# Patient Record
Sex: Male | Born: 1940 | Race: White | Hispanic: No | Marital: Married | Smoking: Former smoker
Health system: Southern US, Community
[De-identification: ages and names within clinical notes are randomized; demographics above are authoritative.]

## PROBLEM LIST (undated history)

## (undated) DIAGNOSIS — K469 Unspecified abdominal hernia without obstruction or gangrene: Secondary | ICD-10-CM

## (undated) DIAGNOSIS — T7840XA Allergy, unspecified, initial encounter: Secondary | ICD-10-CM

## (undated) DIAGNOSIS — G709 Myoneural disorder, unspecified: Secondary | ICD-10-CM

## (undated) DIAGNOSIS — F329 Major depressive disorder, single episode, unspecified: Secondary | ICD-10-CM

## (undated) DIAGNOSIS — J439 Emphysema, unspecified: Secondary | ICD-10-CM

## (undated) DIAGNOSIS — I82409 Acute embolism and thrombosis of unspecified deep veins of unspecified lower extremity: Secondary | ICD-10-CM

## (undated) DIAGNOSIS — F32A Depression, unspecified: Secondary | ICD-10-CM

## (undated) HISTORY — PX: EYE SURGERY: SHX253

## (undated) HISTORY — DX: Depression, unspecified: F32.A

## (undated) HISTORY — DX: Emphysema, unspecified: J43.9

## (undated) HISTORY — PX: HERNIA REPAIR: SHX51

## (undated) HISTORY — DX: Unspecified abdominal hernia without obstruction or gangrene: K46.9

## (undated) HISTORY — PX: OTHER SURGICAL HISTORY: SHX169

## (undated) HISTORY — DX: Acute embolism and thrombosis of unspecified deep veins of unspecified lower extremity: I82.409

## (undated) HISTORY — DX: Myoneural disorder, unspecified: G70.9

## (undated) HISTORY — DX: Allergy, unspecified, initial encounter: T78.40XA

## (undated) HISTORY — DX: Major depressive disorder, single episode, unspecified: F32.9

---

## 1990-06-05 HISTORY — PX: HERNIA REPAIR: SHX51

## 1991-06-06 DIAGNOSIS — I82409 Acute embolism and thrombosis of unspecified deep veins of unspecified lower extremity: Secondary | ICD-10-CM

## 1991-06-06 HISTORY — DX: Acute embolism and thrombosis of unspecified deep veins of unspecified lower extremity: I82.409

## 1994-06-05 HISTORY — PX: HERNIA REPAIR: SHX51

## 2000-11-06 ENCOUNTER — Encounter: Payer: Self-pay | Admitting: Surgery

## 2000-11-06 ENCOUNTER — Encounter: Admission: RE | Admit: 2000-11-06 | Discharge: 2000-11-06 | Payer: Self-pay | Admitting: Surgery

## 2000-11-08 ENCOUNTER — Ambulatory Visit (HOSPITAL_BASED_OUTPATIENT_CLINIC_OR_DEPARTMENT_OTHER): Admission: RE | Admit: 2000-11-08 | Discharge: 2000-11-08 | Payer: Self-pay | Admitting: Surgery

## 2000-11-08 ENCOUNTER — Encounter (INDEPENDENT_AMBULATORY_CARE_PROVIDER_SITE_OTHER): Payer: Self-pay | Admitting: *Deleted

## 2002-07-24 ENCOUNTER — Encounter: Payer: Self-pay | Admitting: Internal Medicine

## 2002-07-24 ENCOUNTER — Encounter: Admission: RE | Admit: 2002-07-24 | Discharge: 2002-07-24 | Payer: Self-pay | Admitting: Internal Medicine

## 2002-11-18 ENCOUNTER — Encounter: Payer: Self-pay | Admitting: Internal Medicine

## 2002-11-18 ENCOUNTER — Encounter: Admission: RE | Admit: 2002-11-18 | Discharge: 2002-11-18 | Payer: Self-pay | Admitting: Internal Medicine

## 2004-06-08 ENCOUNTER — Emergency Department (HOSPITAL_COMMUNITY): Admission: EM | Admit: 2004-06-08 | Discharge: 2004-06-08 | Payer: Self-pay | Admitting: Emergency Medicine

## 2004-06-09 ENCOUNTER — Inpatient Hospital Stay (HOSPITAL_COMMUNITY): Admission: AD | Admit: 2004-06-09 | Discharge: 2004-06-16 | Payer: Self-pay | Admitting: Psychiatry

## 2004-06-09 ENCOUNTER — Ambulatory Visit: Payer: Self-pay | Admitting: Psychiatry

## 2004-09-27 ENCOUNTER — Encounter: Admission: RE | Admit: 2004-09-27 | Discharge: 2004-09-27 | Payer: Self-pay | Admitting: Family Medicine

## 2004-11-03 ENCOUNTER — Encounter: Admission: RE | Admit: 2004-11-03 | Discharge: 2004-11-03 | Payer: Self-pay | Admitting: Internal Medicine

## 2004-11-08 ENCOUNTER — Encounter: Admission: RE | Admit: 2004-11-08 | Discharge: 2004-11-08 | Payer: Self-pay | Admitting: Internal Medicine

## 2004-11-21 ENCOUNTER — Ambulatory Visit: Payer: Self-pay | Admitting: Internal Medicine

## 2004-11-25 ENCOUNTER — Ambulatory Visit: Payer: Self-pay | Admitting: Internal Medicine

## 2004-11-29 ENCOUNTER — Ambulatory Visit: Payer: Self-pay | Admitting: Internal Medicine

## 2005-05-22 ENCOUNTER — Ambulatory Visit: Payer: Self-pay | Admitting: Internal Medicine

## 2005-05-30 ENCOUNTER — Ambulatory Visit: Payer: Self-pay | Admitting: Internal Medicine

## 2005-06-05 HISTORY — PX: HERNIA REPAIR: SHX51

## 2005-09-26 ENCOUNTER — Ambulatory Visit: Payer: Self-pay | Admitting: Internal Medicine

## 2006-01-24 ENCOUNTER — Emergency Department (HOSPITAL_COMMUNITY): Admission: EM | Admit: 2006-01-24 | Discharge: 2006-01-24 | Payer: Self-pay | Admitting: Emergency Medicine

## 2006-01-28 ENCOUNTER — Emergency Department (HOSPITAL_COMMUNITY): Admission: EM | Admit: 2006-01-28 | Discharge: 2006-01-28 | Payer: Self-pay | Admitting: Emergency Medicine

## 2006-01-31 ENCOUNTER — Encounter (HOSPITAL_COMMUNITY): Admission: RE | Admit: 2006-01-31 | Discharge: 2006-05-01 | Payer: Self-pay | Admitting: Emergency Medicine

## 2006-02-21 ENCOUNTER — Inpatient Hospital Stay (HOSPITAL_COMMUNITY): Admission: RE | Admit: 2006-02-21 | Discharge: 2006-02-22 | Payer: Self-pay | Admitting: Surgery

## 2007-01-23 ENCOUNTER — Ambulatory Visit: Payer: Self-pay | Admitting: Internal Medicine

## 2007-08-03 ENCOUNTER — Emergency Department (HOSPITAL_COMMUNITY): Admission: EM | Admit: 2007-08-03 | Discharge: 2007-08-04 | Payer: Self-pay | Admitting: Emergency Medicine

## 2010-06-05 HISTORY — PX: HERNIA REPAIR: SHX51

## 2010-08-17 ENCOUNTER — Emergency Department (HOSPITAL_COMMUNITY): Payer: Medicare Other

## 2010-08-17 ENCOUNTER — Emergency Department (HOSPITAL_COMMUNITY)
Admission: EM | Admit: 2010-08-17 | Discharge: 2010-08-17 | Disposition: A | Discharge status: Home / Self Care | Payer: Medicare Other | Attending: Emergency Medicine | Admitting: Emergency Medicine

## 2010-08-17 DIAGNOSIS — F319 Bipolar disorder, unspecified: Secondary | ICD-10-CM | POA: Insufficient documentation

## 2010-08-17 DIAGNOSIS — R110 Nausea: Secondary | ICD-10-CM | POA: Insufficient documentation

## 2010-08-17 DIAGNOSIS — R109 Unspecified abdominal pain: Secondary | ICD-10-CM | POA: Insufficient documentation

## 2010-08-17 DIAGNOSIS — K458 Other specified abdominal hernia without obstruction or gangrene: Secondary | ICD-10-CM | POA: Insufficient documentation

## 2010-08-17 LAB — DIFFERENTIAL
Eosinophils Relative: 0 % (ref 0–5)
Lymphocytes Relative: 7 % — ABNORMAL LOW (ref 12–46)
Lymphs Abs: 0.9 10*3/uL (ref 0.7–4.0)
Monocytes Absolute: 0.8 10*3/uL (ref 0.1–1.0)
Monocytes Relative: 6 % (ref 3–12)
Neutro Abs: 11.4 10*3/uL — ABNORMAL HIGH (ref 1.7–7.7)

## 2010-08-17 LAB — COMPREHENSIVE METABOLIC PANEL
Albumin: 3.6 g/dL (ref 3.5–5.2)
Alkaline Phosphatase: 83 U/L (ref 39–117)
BUN: 11 mg/dL (ref 6–23)
Calcium: 9.1 mg/dL (ref 8.4–10.5)
Creatinine, Ser: 0.92 mg/dL (ref 0.4–1.5)
Glucose, Bld: 125 mg/dL — ABNORMAL HIGH (ref 70–99)
Potassium: 4.4 mEq/L (ref 3.5–5.1)
Total Protein: 7.1 g/dL (ref 6.0–8.3)

## 2010-08-17 LAB — VALPROIC ACID LEVEL: Valproic Acid Lvl: 30.2 ug/mL — ABNORMAL LOW (ref 50.0–100.0)

## 2010-08-17 LAB — CBC
HCT: 41.8 % (ref 39.0–52.0)
Hemoglobin: 14 g/dL (ref 13.0–17.0)
MCH: 31.2 pg (ref 26.0–34.0)
MCHC: 33.5 g/dL (ref 30.0–36.0)
MCV: 93.1 fL (ref 78.0–100.0)
RDW: 13.2 % (ref 11.5–15.5)

## 2010-08-17 LAB — LIPASE, BLOOD: Lipase: 31 U/L (ref 11–59)

## 2010-09-07 ENCOUNTER — Other Ambulatory Visit: Payer: Self-pay | Admitting: Surgery

## 2010-09-07 ENCOUNTER — Ambulatory Visit
Admission: RE | Admit: 2010-09-07 | Discharge: 2010-09-07 | Disposition: A | Discharge status: Home / Self Care | Payer: Medicare Other | Source: Ambulatory Visit | Attending: Surgery | Admitting: Surgery

## 2010-09-27 ENCOUNTER — Ambulatory Visit: Payer: Medicare Other | Admitting: Internal Medicine

## 2010-10-13 ENCOUNTER — Encounter: Payer: Self-pay | Admitting: Internal Medicine

## 2010-10-13 ENCOUNTER — Ambulatory Visit (INDEPENDENT_AMBULATORY_CARE_PROVIDER_SITE_OTHER): Payer: Medicare Other | Admitting: Internal Medicine

## 2010-10-13 ENCOUNTER — Ambulatory Visit (INDEPENDENT_AMBULATORY_CARE_PROVIDER_SITE_OTHER)
Admission: RE | Admit: 2010-10-13 | Discharge: 2010-10-13 | Disposition: A | Discharge status: Home / Self Care | Payer: Medicare Other | Source: Ambulatory Visit | Attending: Internal Medicine | Admitting: Internal Medicine

## 2010-10-13 ENCOUNTER — Other Ambulatory Visit (INDEPENDENT_AMBULATORY_CARE_PROVIDER_SITE_OTHER): Payer: Medicare Other

## 2010-10-13 VITALS — BP 110/62 | HR 60 | Temp 98.3°F | Resp 16 | Wt 200.0 lb

## 2010-10-13 DIAGNOSIS — N400 Enlarged prostate without lower urinary tract symptoms: Secondary | ICD-10-CM

## 2010-10-13 DIAGNOSIS — Z79899 Other long term (current) drug therapy: Secondary | ICD-10-CM

## 2010-10-13 DIAGNOSIS — F319 Bipolar disorder, unspecified: Secondary | ICD-10-CM

## 2010-10-13 DIAGNOSIS — R05 Cough: Secondary | ICD-10-CM

## 2010-10-13 DIAGNOSIS — Z0000 Encounter for general adult medical examination without abnormal findings: Secondary | ICD-10-CM

## 2010-10-13 DIAGNOSIS — Z125 Encounter for screening for malignant neoplasm of prostate: Secondary | ICD-10-CM

## 2010-10-13 LAB — COMPREHENSIVE METABOLIC PANEL
BUN: 10 mg/dL (ref 6–23)
CO2: 31 mEq/L (ref 19–32)
Calcium: 9.2 mg/dL (ref 8.4–10.5)
Chloride: 104 mEq/L (ref 96–112)
Creatinine, Ser: 0.9 mg/dL (ref 0.4–1.5)
GFR: 83.97 mL/min (ref >60.00)

## 2010-10-13 LAB — CBC WITH DIFFERENTIAL/PLATELET
Basophils Absolute: 0 10*3/uL (ref 0.0–0.1)
Basophils Relative: 0.4 % (ref 0.0–3.0)
Eosinophils Absolute: 0.3 10*3/uL (ref 0.0–0.7)
Lymphocytes Relative: 33.6 % (ref 12.0–46.0)
MCHC: 34.3 g/dL (ref 30.0–36.0)
Monocytes Relative: 11.5 % (ref 3.0–12.0)
Neutrophils Relative %: 49.7 % (ref 43.0–77.0)
RBC: 4.24 Mil/uL (ref 4.22–5.81)
RDW: 13.2 % (ref 11.5–14.6)

## 2010-10-13 LAB — URINALYSIS, ROUTINE W REFLEX MICROSCOPIC
Leukocytes, UA: NEGATIVE
Nitrite: NEGATIVE
Specific Gravity, Urine: 1.02 (ref 1.000–1.030)
Urobilinogen, UA: 0.2 (ref 0.0–1.0)

## 2010-10-13 LAB — LIPID PANEL
HDL: 43.3 mg/dL (ref >39.00)
Triglycerides: 120 mg/dL (ref 0.0–149.0)
VLDL: 24 mg/dL (ref 0.0–40.0)

## 2010-10-13 MED ORDER — PNEUMOCOCCAL VAC POLYVALENT 25 MCG/0.5ML IJ INJ: 0.5000 mL | INJECTION | Freq: Once | INTRAMUSCULAR | Status: DC

## 2010-10-13 NOTE — Assessment & Plan Note (Signed)
Will check a psa and ua

## 2010-10-13 NOTE — Progress Notes (Signed)
Subjective:    Patient ID: Christian Hurley, male    DOB: 1940/10/18, 70 y.o.   MRN: [id number]  HPI New to me he comes in for a complete physical and reports a chronic smoker's cough, he saw Dr. 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 His cough has not worsened. He sees Matthew Saras for bipolar disease and needs labs done.   Review of Systems  Constitutional: Negative for fever, chills, diaphoresis, activity change, appetite change, fatigue and unexpected weight change.  HENT: Negative for sore throat, facial swelling, trouble swallowing, neck pain, neck stiffness and voice change.   Respiratory: Negative for apnea, cough, choking, chest tightness, shortness of breath, wheezing and stridor.   Cardiovascular: Negative for chest pain, palpitations and leg swelling.  Gastrointestinal: Negative for nausea, vomiting, abdominal pain, diarrhea, constipation, blood in stool, abdominal distention, anal bleeding and rectal pain.  Genitourinary: Negative for dysuria, urgency, frequency, hematuria, flank pain, decreased urine volume, discharge, penile swelling, scrotal swelling, enuresis, difficulty urinating, genital sores, penile pain and testicular pain.  Musculoskeletal: Negative for myalgias, back pain, joint swelling, arthralgias and gait problem.  Skin: Negative for color change, pallor and rash.  Neurological: Negative for dizziness, tremors, seizures, syncope, facial asymmetry, speech difficulty, weakness, light-headedness, numbness and headaches.  Hematological: Negative for adenopathy. Does not bruise/bleed easily.  Psychiatric/Behavioral: Negative for suicidal ideas, hallucinations, behavioral problems, confusion, sleep disturbance, self-injury, dysphoric mood, decreased concentration and agitation. The patient is not nervous/anxious and is not hyperactive.        Objective:   Physical Exam  [vitalsreviewed. Constitutional: He is oriented to person, place,  and time. He appears well-developed and well-nourished. No distress.  HENT:  Head: Normocephalic and atraumatic.  Right Ear: External ear normal.  Left Ear: External ear normal.  Nose: Nose normal.  Mouth/Throat: Oropharynx is clear and moist. No oropharyngeal exudate.  Eyes: Conjunctivae and EOM are normal. Pupils are equal, round, and reactive to light. Right eye exhibits no discharge. Left eye exhibits no discharge. No scleral icterus.  Neck: Normal range of motion. Neck supple. No JVD present. No tracheal deviation present. No thyromegaly present.  Cardiovascular: Normal rate, regular rhythm, normal heart sounds and intact distal pulses.  Exam reveals no gallop and no friction rub.   No murmur heard. Pulmonary/Chest: Effort normal and breath sounds normal. No stridor. No respiratory distress. He has no wheezes. He has no rales. He exhibits no tenderness.  Abdominal: Soft. Normal aorta and bowel sounds are normal. He exhibits no shifting dullness, no distension, no pulsatile liver, no fluid wave, no abdominal bruit, no ascites, no pulsatile midline mass and no mass. There is no hepatosplenomegaly, splenomegaly or hepatomegaly. There is no tenderness. There is no rigidity, no rebound, no guarding, no CVA tenderness, no tenderness at McBurney's point and negative Murphy's sign. A hernia is present. Hernia confirmed positive in the ventral area. Hernia confirmed negative in the right inguinal area and confirmed negative in the left inguinal area.    Genitourinary: Rectum normal, prostate normal, testes normal and penis normal. Rectal exam shows no external hemorrhoid, no internal hemorrhoid, no fissure, no mass, no tenderness and anal tone normal. Guaiac negative stool. Prostate is not enlarged and not tender. Right testis shows no mass, no swelling and no tenderness. Left testis shows no mass, no swelling and no tenderness. Circumcised. No penile erythema or penile tenderness. No discharge found.    Musculoskeletal: Normal range of motion. He exhibits no edema  and no tenderness.  Lymphadenopathy:    He has no cervical adenopathy.       Right: No inguinal adenopathy present.       Left: No inguinal adenopathy present.  Neurological: He is alert and oriented to person, place, and time. He has normal reflexes. He displays normal reflexes. No cranial nerve deficit. He exhibits normal muscle tone. Coordination normal.  Skin: Skin is warm and dry. No rash noted. He is not diaphoretic. No erythema. No pallor.  Psychiatric: He has a normal mood and affect. His behavior is normal. Judgment normal.          Assessment & Plan:

## 2010-10-13 NOTE — Assessment & Plan Note (Signed)
The patient is here for annual Medicare wellness examination and management of other chronic and acute problems.   The risk factors are reflected in the social history.  The roster of all physicians providing medical care to patient - is listed in the Snapshot section of the chart.  Activities of daily living:  The patient is 100% inedpendent in all ADLs: dressing, toileting, feeding as well as independent mobility  Home safety : The patient has smoke detectors in the home. They wear seatbelts.No firearms at home ( firearms are present in the home, kept in a safe fashion). There is no violence in the home.   There is no risks for hepatitis, STDs or HIV. There is no   history of blood transfusion. They have no travel history to infectious disease endemic areas of the world.  The patient has (has not) seen their dentist in the last six month. They have (not) seen their eye doctor in the last year. They deny (admit to) any hearing difficulty and have not had audiologic testing in the last year.  They do not  have excessive sun exposure. Discussed the need for sun protection: hats, long sleeves and use of sunscreen if there is significant sun exposure.   Diet: the importance of a healthy diet is discussed. They do have a healthy (unhealthy-high fat/fast food) diet.   Depression screen: there are no signs or vegative symptoms of depression- irritability, change in appetite, anhedonia, sadness/tearfullness.  Cognitive assessment: the patient manages all their financial and personal affairs and is actively engaged. They could relate day,date,year and events; recalled 3/3 objects at 3 minutes; performed clock-face test normally.  The following portions of the patient's history were reviewed and updated as appropriate: allergies, current medications, past family history, past medical history,  past surgical history, past social history  and problem list.  Vision, hearing, body mass index were assessed  and reviewed.   During the course of the visit the patient was educated and counseled about appropriate screening and preventive services including : fall prevention , diabetes screening, nutrition counseling, colorectal cancer screening, and recommended immunizations.  

## 2010-10-13 NOTE — Assessment & Plan Note (Signed)
I will check a cxr to see if cancer has developed

## 2010-10-13 NOTE — Assessment & Plan Note (Signed)
PSA ordered

## 2010-10-13 NOTE — Assessment & Plan Note (Signed)
Labs ordered and I will fax them to [facility]

## 2010-10-13 NOTE — Patient Instructions (Signed)
Health Maintenance in Males MAINTAIN REGULAR HEALTH EXAMS  Maintain a healthy diet and normal weight. Increased weight leads to problems with blood pressure and diabetes. Decrease fat in the diet and increase exercise. Obtain a proper diet from your caregiver if necessary.   High blood pressure causes heart and blood vessel problems. Check blood pressures regularly and keep your blood pressure at normal limits. Aerobic exercise helps this. Persistent elevations of blood pressure should be treated with medications if weight loss and exercise are ineffective.   Avoid smoking, drinking in excess (more than 2 drinks per day), or use of street drugs. Do not share needles with anyone. Ask for help if you need assistance or instructions on stopping the use of alcohol, cigarettes, or drugs.   Maintain normal blood lipids and cholesterol. Your caregiver can give you information to lower your risk of heart disease or stroke.   Ask your caregiver if you are in need of early heart disease screening because of a strong family history of heart disease or signs of elevated testosterone (male sex hormone) levels. These can predispose you to early heart disease.   Practice safe sex. Practicing safe sex decreases your risk for a sexually transmitted infection (STI). Some of the STIs are gonorrhea, chlamydia, syphilis, trichimonas, herpes, human papillomavirus (HPV), and human immunodeficiency virus (HIV). Herpes, HIV, and HPV are viral illnesses that have no cure. These can result in disability, cancer, and death.   It is not safe for someone who has AIDS or is HIV positive to have unprotected sex with a partner who is HIV positive. The reason for this is the fact that there are many different strains of HIV. If you have a strain that is readily treated with medications and then suddenly introduce a strain from a partner that has no further treatment options, you may suddenly have a strain of HIV that is untreatable.  Even if you are both positive for HIV, it is still necessary to practice safe sex.   Use sunscreen with a SPF of 15 or greater. Being outside in the sun when your shadow caused by the sun is shorter than you are, means you are being exposed to sun at greater intensity. Lighter skinned people are at a greater risk of skin cancer.   Keep carbon monoxide and smoke detectors in your home and functioning at all times. Change the batteries every 6 months.   Do monthly examinations of your testicles. The best time to do this is after a hot shower or bath when the tissues are loose. Notify your caregivers of any lumps, tenderness, or changes in size or shape.   Notify your caregiver of new moles or changes in moles, especially if there is a change in shape or color. Also notify your caregiver if a mole is larger than the size of a pencil eraser.   Stay current with your tetanus shots and other required immunizations.  The Body Mass Index (BMI) is a way of measuring how much of your body is fat. Having a BMI above 27 increases the risk of heart disease, diabetes, hypertension, stroke, and other problems related to obesity. Document Released: 11/18/2007 Document Re-Released: 11/09/2009 [employer] Patient Information 2011 [employer]. 

## 2010-10-14 ENCOUNTER — Encounter: Payer: Self-pay | Admitting: Internal Medicine

## 2010-10-18 ENCOUNTER — Encounter (INDEPENDENT_AMBULATORY_CARE_PROVIDER_SITE_OTHER): Payer: Self-pay | Admitting: Surgery

## 2010-10-18 NOTE — Assessment & Plan Note (Signed)
[facility]                             PULMONARY OFFICE NOTE   Christian Hurley, Christian Hurley                      MRN:          [id number]  DATE:01/23/2007                            DOB:          August 05, 1940    PROBLEMS:  1. Asthma/chronic obstructive pulmonary disease.  2. Remote history of deep vein thrombosis/pulmonary embolism in 1993.  3. Abnormal chest CAT.  4. Bipolar disorder.   HISTORY:  Summer heat is bothering some, but he is outside very little.  He got through an abdominal hernia repair, leaving him with a long  midline incision, but apparently tolerated the anesthesia with no  problems.  Rare need, now, for albuterol.  He does notice some  exertional dyspnea; but I do not get the impression that it is changed.  We reviewed his spirometry done in June of 2006 when he had moderate  obstructive disease with significant response to bronchodilator, leaving  him only mildly obstructed at 72% after dilator.   MEDICATIONS:  1. Luvox 100 mg b.i.d.  2. Depakote 1000 mg.  3. Albuterol rescue inhaler used sometimes once a day.   ALLERGIES:  No medication allergy.   OBJECTIVE:  Active facial tics.  There are few faint crackles on chest exam.  Heart sounds are regular without murmur.  Midline abdominal scar.  No edema, no cyanosis.   IMPRESSION:  Asthma/chronic obstructive pulmonary disease currently  stable.   PLAN:  Schedule return in one year.  We refilled albuterol as a rescue  inhaler with discussion of change.  We will probably want to repeat the  pulmonary function tests when he returns.     Clinton D. Maple Hudson, MD, Tonny Bollman, FACP  Electronically Signed    CDY/MedQ  DD: 01/23/2007  DT: 01/24/2007  Job #: [id number]   cc:   Lucita Ferrara, MD

## 2010-10-21 NOTE — Discharge Summary (Signed)
NAMEEDRICK, WHITEHORN               ACCOUNT NO.:  [id number]   MEDICAL RECORD NO.:  [id number]          PATIENT TYPE:  INP   LOCATION:  5731                         FACILITY:  [facility]   PHYSICIAN:  Velora Heckler, MD      DATE OF BIRTH:  20-Aug-1940   DATE OF ADMISSION:  02/20/2006  DATE OF DISCHARGE:  02/22/2006                                 DISCHARGE SUMMARY   REASON FOR ADMISSION:  Ventral incisional hernia, recurrent, and  incarcerated umbilical hernia.   HISTORY OF PRESENT ILLNESS:  The patient is a 70 year old white male, well-  known to my surgical practice.  He had a ventral hernia repair in June 2002.  He has now had recurrence.  He also has an incarcerated umbilical hernia.  He comes to surgery for repair.   HOSPITAL COURSE:  The patient was taken to the operating room on February 20, 2006.  He underwent exploration of his abdominal wound, extraction of  the previous mesh, repair of ventral hernia with AlloDerm and Prolene mesh,  and repair of umbilical hernia with Prolene mesh.  Postoperatively, the  patient did well.  He resumed his diet.  He required local wound care and  teaching regarding drain care.  He is prepared for discharge home on the  second postoperative day.   DISCHARGE PLANNING:  The patient is discharged home on February 22, 2006 in  good condition, tolerating a regular diet, AND ambulating independently.  The patient will be seen back in my office at [facility] surgery in 5  days for wound check and drain removal.   DISCHARGE MEDICATIONS:  Include Vicodin as needed for pain.   FINAL DIAGNOSES:  1. Recurrent ventral incisional hernia.  2. Incarcerated umbilical hernia.   CONDITION ON DISCHARGE:  Improved.      Velora Heckler, MD  Electronically Signed     TMG/MEDQ  D:  02/22/2006  T:  02/24/2006  Job:  [id number]   cc:   Marcene Duos, M.D.

## 2010-10-21 NOTE — Discharge Summary (Signed)
Christian Hurley, PORZIO NO.:  [id number]   MEDICAL RECORD NO.:  [id number]          PATIENT TYPE:  IPS   LOCATION:  0504                          FACILITY:  [facility]   PHYSICIAN:  Geoffery Lyons, M.D.      DATE OF BIRTH:  Nov 02, 1940   DATE OF ADMISSION:  06/09/2004  DATE OF DISCHARGE:  06/16/2004                                 DISCHARGE SUMMARY   CHIEF COMPLAINT AND PRESENTING ILLNESS:  This was the first admission to  [facility]  for this 70 year old married white male,  involuntarily committed.  The patient had been hostile since Christmas day,  pushing his wife and threatening her.  He was considered a danger to himself  and others.  He has been cursing people in the cafeterias since he has been  hospitalized, has been agitated.   PAST PSYCHIATRIC HISTORY:  First time [employer], unclear psychiatric  history at the time of the initial evaluation.   ALCOHOL AND DRUG HISTORY:  Nonsmoker, no clear evidence of any alcohol or  substance use.   PAST MEDICAL HISTORY:  Noncontributory.   MEDICATIONS:  Luvox 100 twice a day, Valium 5 mg twice a day, Depakote ER  500 mg daily, Klonopin 0.5 at night.   PHYSICAL EXAMINATION:  Performed, failed to show any acute findings.   LABORATORY WORKUP:  CBC was within normal limits.  CMET:  Blood sugar 104.  Depakote level 39.  Blood screen positive for benzodiazepines.  CT scan was  negative.   MENTAL STATUS EXAM:  Reveals a sleepy, elderly male, difficult to provide  any information due to his sedation.  Speech was somewhat slurred at times  but he was trying to cooperate.  Mood was tired, affect was sedated.  Thought processes were not spontaneous, did not seem to be responding to  internal stimuli.  There was no evidence of delusions, no evidence of  hallucinations.  Cognition well preserved.   ADMISSION DIAGNOSES:   AXIS I:  Bipolar disorder.   AXIS II:  No diagnosis.   AXIS III:  No  diagnosis.   AXIS IV:  Moderate.   AXIS V:  Global assessment of function upon admission 25, highest global  assessment of function in past year 65.   COURSE IN HOSPITAL:  He was admitted and started on individual and group  psychotherapy.  He was maintained on Luvox 100 mg twice a day, Klonopin 0.5  at night, Valium 5 mg twice a day as needed, Depakote ER 500 mg every day.  Initially he was given Seroquel 100 every 6 hours as needed for agitation.  Seemed to be too strong so it was decreased to 50 every 6 hours as needed.  Klonopin was changed to 1 mg 4 times a day as needed.  Depakote was placed  at Depakote ER 500 in the morning and 500 at night.  He was also given  Seroquel 50 twice a day and 50 at night.  He was given Cipro.  Klonopin was  discontinued and his Valium was initially increased.  He apparently was  placed  on Cipro for a possible UTI.  Apparently he had bladder surgery.  He  was being treated with Cipro.  It was established that he had a history of  bipolar disorder.  He has been angry and irritable.  Initially loud, easily  redirectable.  He started tolerating medication well.  He did not remember  being argumentative with his wife.  Endorsed he was having a lot of  frustration and financial stressors.  He was concerned that his wife managed  his money and did not know where it went.  He was cooperative but somewhat  withdrawn.  There was some information that the sister called the unit  saying that the wife had gone to the magistrate to have him committed, the  sister claimed that the wife spent the patient's money.  He did endorse that  he might have been having mood swings, but he was willing to accept the  medication, endorsed he did not remember what happened.  Initially some  psychomotor retardation.  He settled down and he was more subdued.  Family  session with the wife, she was concerned about his walking, some sedation.  We adjusted the medication further.   He started saying that he was feeling  better.  He was going to group.  With adjusted medication he was able to  rest a night.  His mood improved, his affect was brighter, and on January 12  he was in full contact with reality.  There were no suicidal ideas, no  homicidal ideas, no hallucinations, no delusions.  He was wanting to go  home.  He was compliant with medications.  No evidence of aggressiveness.  He was willing to pursue outpatient treatment, so we went ahead and  discharged to outpatient follow-up.   DISCHARGE DIAGNOSES:   AXIS I:  Bipolar disorder.   AXIS II:  No diagnosis.   AXIS III:  No diagnosis.   AXIS IV:  Moderate.   AXIS V:  Global assessment of function upon discharge 50.   DISCHARGE MEDICATIONS:  1.  Luvox 100 twice a day.  2.  Depakote ER 500 in the morning, 500 at night.  3.  Cipro 500 twice a day for 3 weeks.  4.  Valium 5 mg 1/2 twice a day and at night.   DISPOSITION:  Follow up with Rowland Lathe and Dr. Archie Patten.      IL/MEDQ  D:  07/05/2004  T:  07/05/2004  Job:  [id number]

## 2010-10-21 NOTE — Op Note (Signed)
Christian Hurley, Christian Hurley               ACCOUNT NO.:  [id number]   MEDICAL RECORD NO.:  [id number]          PATIENT TYPE:  OIB   LOCATION:  5731                         FACILITY:  [facility]   PHYSICIAN:  Velora Heckler, MD      DATE OF BIRTH:  Nov 05, 1940   DATE OF PROCEDURE:  02/20/2006  DATE OF DISCHARGE:                                 OPERATIVE REPORT   PREOPERATIVE DIAGNOSES:  1. Recurrent ventral incisional hernia.  2. Incarcerated umbilical hernia.   POSTOPERATIVE DIAGNOSES:  1. Recurrent ventral incisional hernia.  2. Incarcerated umbilical hernia.   PROCEDURE:  1. Extraction mesh from epigastrium.  2. Repair recurrent ventral incisional hernia with AlloDerm and Prolene      mesh.  3. Repair umbilical hernia with Prolene mesh.   SURGEON:  Velora Heckler, M.D., FACS   ANESTHESIA:  General.   ESTIMATED BLOOD LOSS:  Minimal.   PREPARATION:  Betadine.   COMPLICATIONS:  None.   INDICATIONS:  The patient is a 70 year old white male well known to my  surgical practice.  He had had previous ventral hernia repair in June2002 at  [facility] day surgery center.  The patient had developed a significant  stasis.  However, the patient returned in July2007 with a frank recurrence  of this ventral incisional hernia as well as an incarcerated umbilical  hernia.  He now comes to surgery for concurrent repair.   BODY OF REPORT:  Procedure is done in OR #16 at [facility].  The patient was brought to the operating room, placed in supine  position on the operating room table.  Following administration of general  anesthesia, the patient is prepped and draped in the usual strict aseptic  fashion.  After ascertaining that an adequate level of anesthesia had been  obtained, midline abdominal incision was made from the xiphoid process to  the umbilicus with a #10 blade.  Dissection was carried through subcutaneous  tissues.  In the upper abdomen there is incarcerated omentum.   Dissection  was carried down to the fascial plane which was then developed  circumferentially using skin rakes and the electrocautery.  At the umbilicus  there is incarcerated omentum.  This was dissected out the subcutaneous  tissues down to the fascia.  It is transected at the level of the fascia  with electrocautery and discarded.  Remaining adipose tissue was reduced  back within the peritoneal cavity.  Fascial defect measures 1 cm.  It is  closed with interrupted #1 Novofil sutures.   In the epigastrium there is a previous sheet of what appears to be Prolene  mesh.  The recurrence has occurred around the inferior edge of the mesh and  over the superior edge of the mesh at the level of the xiphoid process.  The  mesh was dissected out and is dissected off of the anterior abdominal wall  and discarded.  Adherent to the undersurface of the mesh was omentum which  was dissected off the mesh with electrocautery and good hemostasis obtained  with electrocautery.  No bowel involvement is identified.  The fascial  defect measures approximately 5 x 8 cm in size.  Due to its location  adjacent to the costal margin and the xiphoid process, reapproximation of  the fascia was not possible.  Therefore the fascia is replaced with a 4 x 7  cm sheet of AlloDerm.  The AlloDerm is opened and brought on the field.  It  was trimmed to the appropriate dimensions.  It is secured to the edges of  the fascial defect circumferentially with interrupted #1 Novofil sutures.  AlloDerm covers the defect nicely with good tension across the graft.  Next  the entire anterior abdominal wall from above the xiphoid process to below  the umbilicus was reinforced with a sheet of polypropylene mesh.  Mesh was  cut to the appropriate dimensions.  It is secured circumferentially with  interrupted #1 Novofil sutures to the anterior fascia of the abdominal wall.  There is good approximation of the mesh to the wall throughout.   Two 30-  Jamaica Blake drains were brought in inferiorly and placed over the mesh on  both the left and right sides.  They are secured to the skin with 3-0 nylon  sutures.  Subcutaneous tissues were closed with running 2-0 Vicryl sutures.  Wound is irrigated.  Skin was closed with stainless steel staples.  Drains  were placed to bulb suction.  Sterile dressings were applied.  Abdominal  binder was applied.  The patient is awakened from anesthesia and brought to  the recovery room in stable condition.  The patient tolerated the procedure  well.      Velora Heckler, MD  Electronically Signed     TMG/MEDQ  D:  02/20/2006  T:  02/21/2006  Job:  [id number]   cc:   Marcene Duos, M.D.

## 2010-10-21 NOTE — Op Note (Signed)
[place]. [facility]  Patient:    Christian Hurley, Christian Hurley                      MRN: [id number] Proc. Date: 11/08/00 Adm. Date:  [id number] Attending:  Bonnetta Barry CC:         Julieanne Manson, M.D.   Operative Report  PREOPERATIVE DIAGNOSIS: 1. Ventral hernia. 2. Sebaceous cyst on back.  POSTOPERATIVE DIAGNOSIS: 1. Ventral hernia. 2. Sebaceous cyst on back.  OPERATION PERFORMED: 1. Repair of ventral hernia with Prolene mesh. 2. Excise sebaceous cyst from back (2 x 3 x 4 cm).  SURGEON:  Velora Heckler, M.D.  ANESTHESIA:  General.  ESTIMATED BLOOD LOSS:  Minimal.  PREPARATION:  Betadine.  COMPLICATIONS:  None.  INDICATIONS FOR PROCEDURE:  The patient is a 70 year old white male who presents with ventral hernia in the epigastrium which has become increasingly larger in size and causing mild symptoms, mainly discomfort.  The patient also has a large sebaceous cyst on the back which was causing him discomfort.  He now comes to surgery for ventral hernia and excision of sebaceous cyst.  DESCRIPTION OF PROCEDURE:  The procedure was done in OR #7 at the [facility].  The patient was brought to the operating room and placed in supine position on the operating room table.  Following administration of general anesthesia, the patient was prepped and draped in the usual strict aseptic fashion.  After ascertaining that an adequate level of anesthesia had been obtained, an upper midline abdominal incision was made with a #15 blade. Dissection was carried down through the subcutaneous tissues.  Herniated omentum was noted in the subcutaneous tissues.  This was dissected out down to the fascia.  A 1.5 cm defect was present in the midline.  Edges were defined and the omentum was reduced back within the peritoneal cavity.  Defect was closed with interrupted 0 Ethibond sutures.  Subcutaneous flaps were raised along the anterior fascia  circumferentially.  An 8 x 8 cm piece of Prolene mesh was fashioned to the appropriate shape and placed on the fascia.  It was secured circumferentially with interrupted 0 Ethibond sutures.  A #10 Blake drain was brought in from an inferior stab wound and placed across the mesh. It was secured to the skin with a 0 Ethibond suture.  Subcutaneous tissues were closed with interrupted 3-0 Vicryl sutures.  The skin was closed with interrupted 4-0 Vicryl subcuticular suture.  Drain was placed to bulb suction. Benzoin and Steri-Strips were applied.  Sterile gauze dressings were applied.  Next the patient was turned to a left lateral decubitus position.  The upper back was prepped in the usual strict aseptic fashion.  There was a large sebaceous cyst just to the left of the midline.  An elliptical incision was made over the cyst so as to excise the sinus tract.  Dissection was carried into the subcutaneous tissues using the electrocautery for hemostasis.  The entire cyst wall and it contents were excised.  Good hemostasis was noted. Skin edges were reapproximated with interrupted 4-0 Vicryl subcuticular sutures.  The wound was washed and dried and benzoin and Steri-Strips were applied.  Sterile gauze dressings were applied.  The patient was awakened from anesthesia and brought to the recovery room in stable condition.  The patient tolerated the procedure well. DD:  11/08/00 TD:  11/08/00 Job: [id number]

## 2010-10-21 NOTE — H&P (Signed)
Christian, Hurley NO.:  [id number]   MEDICAL RECORD NO.:  [id number]          PATIENT TYPE:  IPS   LOCATION:  0305                          FACILITY:  [facility]   PHYSICIAN:  Christian Hurley, M.D. DATE OF BIRTH:  07-30-1940   DATE OF ADMISSION:  06/09/2004  DATE OF DISCHARGE:                         PSYCHIATRIC ADMISSION ASSESSMENT   IDENTIFYING INFORMATION:  This is a 70 year old, married, white male,  involuntarily committed on June 08, 2004.   HISTORY OF PRESENT ILLNESS:  The patient presents a history of petition.  The petition papers state the patient has been hostile since Christmas Day,  pushing his wife and threatening her.  He is considered a danger to himself  and others.  The patient has been cursing people in the cafeteria since he  has been hospitalized; has been agitated.   PAST PSYCHIATRIC HISTORY:  This is the first admission to [facility].  Past psychiatric history is unclear at this time.   SOCIAL HISTORY:  This is a 70 year old, married, white male.  Married for 15  years.  Lives with his wife.  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   FAMILY HISTORY:  Unclear.   ALCOHOL/DRUG HISTORY:  Nonsmoker, alcohol and drug use are apparently not a  problem.   PRIMARY CARE Christian Hurley:  Unknown.   PAST MEDICAL HISTORY:  Unknown.   MEDICATIONS:  1.  The patient has been on Luvox 100 mg b.i.d.  2.  Valium 5 mg b.i.d.  3.  Depakote ER 500 mg daily.  4.  Klonopin 0.5 at h.s.   DRUG ALLERGIES:  No known drug allergies.   PHYSICAL EXAMINATION:  GENERAL:  The patient was assessed at [facility].  He  is an elderly male in no acute distress, currently very sleepy from the  sedation.  He appears in no acute distress and appears well-nourished.  VITAL SIGNS:  Temperature is 97.9; 63 heart rate; 20 respirations; blood  pressure is 116/64; 6 feet tall; 183 pounds.   DIAGNOSTIC STUDIES:  CBC  is within normal limits.  CMET shows a blood sugar  of 104.  Valproic acid level is 39.  Drug screen was positive for  benzodiazepines.  Alcohol level less than 5.  Urinalysis was negative.  CAT  scan was negative.   MENTAL STATUS EXAM:  This is a sleepy, elderly male.  Difficult to provide  information due to his sedation.  Speech is somewhat slurred at this time.  Patient is trying to cooperate.  Mood is tired.  Affect is sedated.  Thought  processes - He does not seem to have any apparent internal stimuli.  He is a  poor historian.  Difficult to ascertain others parameters due to his  sedation.   ADMISSION DIAGNOSES:   AXIS I:  Bipolar disorder.   AXIS II:  Deferred.   AXIS III:  None noted.   AXIS IV:  Deferred at this time.   AXIS V:  Current is 25; estimated this past year is 45.   PLAN:  Involuntarily commit him for  manic type behavior, contract for safety  and resume his medications.  We will check his valproic acid level.  We will  add Seroquel for agitation.  The patient's chart reports the patient  received a prescription for Cipro.  We will contact wife to clarify the  etiology of need for the Cipro and any concerns and past history she can  provide.  Patient is to follow-up with mental health.   TENTATIVE LENGTH OF STAY:  Five to seven days.     Jani   JO/MEDQ  D:  06/09/2004  T:  06/09/2004  Job:  [id number]

## 2010-12-05 ENCOUNTER — Encounter (HOSPITAL_COMMUNITY): Payer: Medicare Other

## 2010-12-05 ENCOUNTER — Other Ambulatory Visit (INDEPENDENT_AMBULATORY_CARE_PROVIDER_SITE_OTHER): Payer: Self-pay | Admitting: General Surgery

## 2010-12-05 LAB — BASIC METABOLIC PANEL
Calcium: 9 mg/dL (ref 8.4–10.5)
GFR calc non Af Amer: >60 mL/min (ref >60)
Glucose, Bld: 88 mg/dL (ref 70–99)
Sodium: 137 mEq/L (ref 135–145)

## 2010-12-05 LAB — DIFFERENTIAL
Basophils Relative: 1 % (ref 0–1)
Eosinophils Absolute: 0.3 10*3/uL (ref 0.0–0.7)
Monocytes Absolute: 0.7 10*3/uL (ref 0.1–1.0)
Monocytes Relative: 12 % (ref 3–12)
Neutro Abs: 2.2 10*3/uL (ref 1.7–7.7)

## 2010-12-05 LAB — SURGICAL PCR SCREEN: MRSA, PCR: NEGATIVE

## 2010-12-05 LAB — CBC
Hemoglobin: 13.8 g/dL (ref 13.0–17.0)
MCH: 30.6 pg (ref 26.0–34.0)
MCHC: 33.1 g/dL (ref 30.0–36.0)

## 2010-12-16 ENCOUNTER — Inpatient Hospital Stay (HOSPITAL_COMMUNITY)
Admission: RE | Admit: 2010-12-16 | Discharge: 2010-12-25 | DRG: 336 | Disposition: A | Discharge status: Home / Self Care | Payer: Medicare Other | Source: Ambulatory Visit | Attending: General Surgery | Admitting: General Surgery

## 2010-12-16 DIAGNOSIS — K432 Incisional hernia without obstruction or gangrene: Secondary | ICD-10-CM

## 2010-12-16 DIAGNOSIS — K660 Peritoneal adhesions (postprocedural) (postinfection): Secondary | ICD-10-CM | POA: Diagnosis present

## 2010-12-16 DIAGNOSIS — F319 Bipolar disorder, unspecified: Secondary | ICD-10-CM | POA: Diagnosis present

## 2010-12-16 DIAGNOSIS — K560 Paralytic ileus: Secondary | ICD-10-CM | POA: Diagnosis not present

## 2010-12-16 DIAGNOSIS — J4489 Other specified chronic obstructive pulmonary disease: Secondary | ICD-10-CM | POA: Diagnosis present

## 2010-12-16 DIAGNOSIS — R0902 Hypoxemia: Secondary | ICD-10-CM | POA: Diagnosis not present

## 2010-12-16 DIAGNOSIS — J449 Chronic obstructive pulmonary disease, unspecified: Secondary | ICD-10-CM | POA: Diagnosis present

## 2010-12-16 DIAGNOSIS — E876 Hypokalemia: Secondary | ICD-10-CM | POA: Diagnosis not present

## 2010-12-16 LAB — BASIC METABOLIC PANEL
CO2: 26 mEq/L (ref 19–32)
Calcium: 8.1 mg/dL — ABNORMAL LOW (ref 8.4–10.5)
Glucose, Bld: 172 mg/dL — ABNORMAL HIGH (ref 70–99)
Potassium: 3.9 mEq/L (ref 3.5–5.1)
Sodium: 137 mEq/L (ref 135–145)

## 2010-12-16 LAB — CBC
Hemoglobin: 12.8 g/dL — ABNORMAL LOW (ref 13.0–17.0)
MCH: 30.8 pg (ref 26.0–34.0)
RBC: 4.15 MIL/uL — ABNORMAL LOW (ref 4.22–5.81)

## 2010-12-16 NOTE — Op Note (Signed)
NAMESHREY, BOIKE NO.:  [id number]  MEDICAL RECORD NO.:  [id number]  LOCATION:  0003                         FACILITY:  [facility]  PHYSICIAN:  Juanetta Gosling, MDDATE OF BIRTH:  Aug 27, 1940  DATE OF PROCEDURE:  12/16/2010 DATE OF DISCHARGE:                              OPERATIVE REPORT   PREOPERATIVE DIAGNOSIS:  Recurrent incisional ventral hernia.  POSTOPERATIVE DIAGNOSIS:  Recurrent incisional ventral hernia.  PROCEDURES: 1. Lysis of adhesions. 2. Open ventral hernia repair with 25 x 35 cm Physiomesh. 3. Bilateral external abdominal oblique component separation.  SURGEON:  Troy Sine. Dwain Sarna, M.D.  ASSISTANT:  Velora Heckler, M.D.  ANESTHESIA:  General.  SUPERVISING ANESTHESIOLOGIST:  Jenelle Mages. Fortune, M.D.  DRAINS:  Two 19-French Blake drains to subcutaneous space.  SPECIMENS:  None.  COMPLICATIONS:  None.  BLOOD LOSS:  150 mL.  DISPOSITION:  To recovery room in stable condition.  INDICATIONS:  This 70 year old patient then repaired by Dr. Gerrit Friends twice for a ventral hernia before.  His underwent his first repair in 2002 for 1.5 cm midline defect repair with a piece of Prolene mesh in an onlay fashion.  He was returned to the operating room in 2007 where a had explantation of his prior mesh and repair with AlloDerm and Prolene mesh.  AlloDerm was placed as an inlay, this was then covered with a large piece of polypropylene mesh.  This did well until some time in 2011 when he developed a recurrent hernia.  I saw him in evaluation with Dr. Gerrit Friends to discuss repair of what was a fairly significant epigastric hernia with some loss of domain in that situation.  I discussed an open hernia repair with possible explantation of the mesh and fixation to his xiphoid and ribs along with component separation.  He and his wife were both agreeable to this procedure.   PROCEDURE:  After informed consent, the patient was taken to the operating  room.  He was administered 1 g of intravenous cefazolin prior to beginning the operation.  He was given 5000 units of subcutaneous heparin prior to this due do his history of a DVT potentially.  He was then placed under general anesthesia without complication.  An orogastric tube and a Foley catheter were placed without difficulty.  His abdomen was then prepped and draped in standard sterile surgical fashion.  Surgical time-out was then performed.  I excised the lower two-thirds of his prior scar, underneath where I felt he had herniated.  I then was able to enter into his abdomen inferiorly with lysis of adhesions and entered into his abdomen, it took about 30 minutes to get into his intraperitoneal cavity.  There was no injury during this portion of the procedure.  He ended up having a fairly significant defect that was present all the way up to his xiphoid process.  At this point, I had everything reduced down in place and I made subcutaneous flaps on both sides well out laterally.  I then released his external abdominal oblique and got about 10 cm, this closed the defect except for the portion up right near where his costal margin was.  He had a very obtuse costal  margin and this was just not going to be closed.  I did release his oblique about 5 cm up on his rib cage on both sides also.  I then placed a 25 x 35 cm Physiomesh.  I fixed this with 8 sutures and with bony fixation superiorly and I got very good overlap of about 5 cm superior to the defect.  Superior to this, there was an additional 2 cm of mesh that I just laid over the diaphragm. These were driven through this xiphoid process as well as the ribs on either side.  I then every about 2 cm placed additional #1 Prolene sutures throughout the mesh under direct vision.  This pulled the mesh up and it was to be in good position .  The sponge and needle count were correct before we secured the mesh in its entirety.  I then  closed the fascia except for a portion of about 4 cm at the superior aspect of this.  This fascia came together quite easily.  I then irrigated, obtained hemostasis.  I placed two Blake drains in the space coming out superiorly, secured with 2-0 nylon suture.  Following this, I then closed the old mesh that had extensively eventuated into the defect with Prolene suture.  I then closed the subcutaneous tissue with Vicryl suture and the skin with staples.  Bacitracin and sterile dressing were placed on this.  His drains were functional.  He tolerated this well, was extubated and transferred to recovery room in stable condition.     Juanetta Gosling, MD     MCW/MEDQ  D:  12/16/2010  T:  12/16/2010  Job:  [id number]  cc:   Sanda Linger, MD [address] [place] [address] [place] [address]  Electronically Signed by Emelia Loron MD on 12/16/2010 08:31:19 PM

## 2010-12-17 LAB — CBC
Hemoglobin: 12.9 g/dL — ABNORMAL LOW (ref 13.0–17.0)
MCH: 30.5 pg (ref 26.0–34.0)
MCV: 94.8 fL (ref 78.0–100.0)
RBC: 4.23 MIL/uL (ref 4.22–5.81)

## 2010-12-17 LAB — BASIC METABOLIC PANEL
BUN: 24 mg/dL — ABNORMAL HIGH (ref 6–23)
CO2: 21 mEq/L (ref 19–32)
Calcium: 8.3 mg/dL — ABNORMAL LOW (ref 8.4–10.5)
Creatinine, Ser: 1.34 mg/dL (ref 0.50–1.35)
Glucose, Bld: 142 mg/dL — ABNORMAL HIGH (ref 70–99)

## 2010-12-18 ENCOUNTER — Inpatient Hospital Stay (HOSPITAL_COMMUNITY): Payer: Medicare Other

## 2010-12-18 LAB — BASIC METABOLIC PANEL
CO2: 23 mEq/L (ref 19–32)
Calcium: 8.4 mg/dL (ref 8.4–10.5)
Chloride: 103 mEq/L (ref 96–112)
Glucose, Bld: 111 mg/dL — ABNORMAL HIGH (ref 70–99)
Potassium: 5.5 mEq/L — ABNORMAL HIGH (ref 3.5–5.1)
Sodium: 133 mEq/L — ABNORMAL LOW (ref 135–145)

## 2010-12-19 LAB — BASIC METABOLIC PANEL
CO2: 31 mEq/L (ref 19–32)
Calcium: 8.5 mg/dL (ref 8.4–10.5)
Chloride: 99 mEq/L (ref 96–112)
Creatinine, Ser: 1.13 mg/dL (ref 0.50–1.35)
Glucose, Bld: 105 mg/dL — ABNORMAL HIGH (ref 70–99)
Sodium: 133 mEq/L — ABNORMAL LOW (ref 135–145)

## 2010-12-19 LAB — CBC
Hemoglobin: 10.4 g/dL — ABNORMAL LOW (ref 13.0–17.0)
MCH: 31.6 pg (ref 26.0–34.0)
MCV: 94.8 fL (ref 78.0–100.0)
Platelets: 149 10*3/uL — ABNORMAL LOW (ref 150–400)
RBC: 3.29 MIL/uL — ABNORMAL LOW (ref 4.22–5.81)
WBC: 6.8 10*3/uL (ref 4.0–10.5)

## 2010-12-20 ENCOUNTER — Inpatient Hospital Stay (HOSPITAL_COMMUNITY): Payer: Medicare Other

## 2010-12-20 LAB — CBC
HCT: 30.1 % — ABNORMAL LOW (ref 39.0–52.0)
MCH: 31.9 pg (ref 26.0–34.0)
MCV: 92.3 fL (ref 78.0–100.0)
RBC: 3.26 MIL/uL — ABNORMAL LOW (ref 4.22–5.81)
WBC: 5.7 10*3/uL (ref 4.0–10.5)

## 2010-12-20 LAB — BASIC METABOLIC PANEL
BUN: 14 mg/dL (ref 6–23)
CO2: 34 mEq/L — ABNORMAL HIGH (ref 19–32)
Calcium: 8.4 mg/dL (ref 8.4–10.5)
Chloride: 97 mEq/L (ref 96–112)
Creatinine, Ser: 0.86 mg/dL (ref 0.50–1.35)

## 2010-12-21 LAB — BASIC METABOLIC PANEL
BUN: 15 mg/dL (ref 6–23)
CO2: 35 mEq/L — ABNORMAL HIGH (ref 19–32)
Calcium: 8.6 mg/dL (ref 8.4–10.5)
Creatinine, Ser: 0.72 mg/dL (ref 0.50–1.35)
GFR calc non Af Amer: >60 mL/min (ref >60)
Glucose, Bld: 97 mg/dL (ref 70–99)

## 2010-12-23 LAB — BASIC METABOLIC PANEL
BUN: 15 mg/dL (ref 6–23)
Calcium: 8.6 mg/dL (ref 8.4–10.5)
Creatinine, Ser: 0.83 mg/dL (ref 0.50–1.35)
GFR calc non Af Amer: >60 mL/min (ref >60)
Glucose, Bld: 97 mg/dL (ref 70–99)

## 2010-12-24 LAB — BASIC METABOLIC PANEL
BUN: 14 mg/dL (ref 6–23)
Calcium: 8.7 mg/dL (ref 8.4–10.5)
Chloride: 102 mEq/L (ref 96–112)
Creatinine, Ser: 0.74 mg/dL (ref 0.50–1.35)
GFR calc Af Amer: >60 mL/min (ref >60)
GFR calc non Af Amer: >60 mL/min (ref >60)

## 2011-01-05 ENCOUNTER — Other Ambulatory Visit (INDEPENDENT_AMBULATORY_CARE_PROVIDER_SITE_OTHER): Payer: Self-pay | Admitting: General Surgery

## 2011-01-05 ENCOUNTER — Telehealth (INDEPENDENT_AMBULATORY_CARE_PROVIDER_SITE_OTHER): Payer: Self-pay

## 2011-01-05 DIAGNOSIS — Z8719 Personal history of other diseases of the digestive system: Secondary | ICD-10-CM

## 2011-01-05 MED ORDER — HYDROCODONE-ACETAMINOPHEN 5-325 MG PO TABS: 1.0000 | ORAL_TABLET | Freq: Four times a day (QID) | ORAL | Status: AC | PRN

## 2011-01-05 NOTE — Telephone Encounter (Signed)
Mrs. Crooke called stating her husbands pain medication (oxycodone) is too strong and would like a different prescription.  Spoke with Dr. Dwain Sarna he approved a prescription for Vicodin.

## 2011-01-09 ENCOUNTER — Encounter (INDEPENDENT_AMBULATORY_CARE_PROVIDER_SITE_OTHER): Payer: Self-pay | Admitting: General Surgery

## 2011-01-09 ENCOUNTER — Ambulatory Visit (INDEPENDENT_AMBULATORY_CARE_PROVIDER_SITE_OTHER): Payer: Medicare Other | Admitting: General Surgery

## 2011-01-09 DIAGNOSIS — Z09 Encounter for follow-up examination after completed treatment for conditions other than malignant neoplasm: Secondary | ICD-10-CM

## 2011-01-09 DIAGNOSIS — K439 Ventral hernia without obstruction or gangrene: Secondary | ICD-10-CM

## 2011-01-09 MED ORDER — HYDROCODONE-ACETAMINOPHEN 10-325 MG PO TABS: 1.0000 | ORAL_TABLET | ORAL | Status: AC | PRN

## 2011-01-09 NOTE — Progress Notes (Signed)
Subjective:     Patient ID: Christian Hurley, male   DOB: Nov 03, 1940, 70 y.o.   MRN: [id number]  HPI This is a 70 year old male with a recurrent ventral incisional hernia who I took to the operating room about 3 weeks ago now and did a ventral hernia repair with component separation and physiomesh underlay. This was a epigastric hernia was fairly difficult to repair. He did well in the hospital postoperatively outside having some pulmonary issues. He eventually was sent home with his drains in place. He is eating normally and having normal bowel movements. He reports no complaints except for some redness around the right-sided JP drain. His drainage has decreased to less than 30 cc really a day on both sides at this point. His wife report no complaints.  Review of Systems     Objective:   Physical Exam Well healed midline incision without infection, jp drains with some mild erythema around right sided drain I think this is just reaction to tubing, I removed both drains today    Assessment:     S/p vh repair with mesh, component separation    Plan:         He is going to continue his restricted activity. I asked him to come back and see me in 2 weeks just for an additional followup. Both his drains have been removed at this point. I told him these holes would seal on her on over the next several days and keep him covered until then. He is going to begin showering now also.

## 2011-01-13 NOTE — Discharge Summary (Signed)
  Christian, Hurley NO.:  [id number]  MEDICAL RECORD NO.:  [id number]  LOCATION:  1526                         FACILITY:  [facility]  PHYSICIAN:  Juanetta Gosling, MDDATE OF BIRTH:  08/12/1940  DATE OF ADMISSION:  12/16/2010 DATE OF DISCHARGE:  12/25/2010                              DISCHARGE SUMMARY   ADMISSION DIAGNOSES: 1. Recurrent ventral incisional hernia. 2. Bipolar disease. 3. Remote history of deep vein thrombosis in 1993. 4. Chronic obstructive pulmonary disease.  DISCHARGE DIAGNOSES: 1. Recurrent ventral incisional hernia. 2. Bipolar disease. 3. Remote history of deep vein thrombosis in 1993. 4. Chronic obstructive pulmonary disease. 5. Resolving ileus.  PROCEDURES PERFORMED:  December 16, 2010, lysis of adhesions, open ventral hernia repair with 25 x 35 cm Physiomesh and bilateral external abdominal oblique component separation.  HISTORY AND HOSPITAL COURSE:  Mr. Christian Hurley is a 70 year old male who is a patient of Dr. Darnell Level who has undergone multiple hernia repairs in the past.  He had a recurrent hernia.  Dr. Gerrit Friends and I both evaluated him and decided to combine and doing a open ventral repair with likely explanting his old mesh combined with component separation. We discussed the risks and benefits associated with this.  He underwent an uneventful operation on December 16, 2010.  Postoperatively, initially he was maintained in the ICU due to the fact that he had a preexisting lung disease.  He did well postoperatively and he was brought around with his pulmonary toilet.  I began him on clear liquids on postoperative day #3. His creatinine was stable and removed his Foley at that point in time. He was maintained on PCA still at that point.  Over the next several days, he was volume overloaded and he was diuresed.  His ileus appeared to resolve and his diet was slowly advanced as well.  He was seen by Physical Therapy during the hospital  stay.  His drains remained in, were putting out serosanguineous fluid.  He had a bowel movement on the 20th. Again, his diet was advanced to a regular diet at this point and he was discharged home on postoperative day #9.  Consults are Physical Therapy.  PERTINENT RADIOLOGIC EVALUATION:  A portable chest postoperatively showed low lung volume, some pulmonary venous congestion.  His pertinent laboratory evaluation showed a discharge white blood cell count of 5.7 and a discharge creatinine of 0.74.  Pathology was none.  Follow up is with Dr. Dwain Sarna in 1 week for a drain check.  Medications upon discharge were enteric-coated aspirin, Depakote, Luvox, and Percocet as needed for pain.     Juanetta Gosling, MD     MCW/MEDQ  D:  01/12/2011  T:  01/13/2011  Job:  [id number]  Electronically Signed by Emelia Loron MD on 01/13/2011 04:01:33 AM

## 2011-01-16 ENCOUNTER — Encounter: Payer: Self-pay | Admitting: Internal Medicine

## 2011-01-16 ENCOUNTER — Ambulatory Visit (INDEPENDENT_AMBULATORY_CARE_PROVIDER_SITE_OTHER): Payer: Medicare Other | Admitting: Internal Medicine

## 2011-01-16 VITALS — BP 110/64 | HR 72 | Temp 98.7°F | Resp 16

## 2011-01-16 DIAGNOSIS — J449 Chronic obstructive pulmonary disease, unspecified: Secondary | ICD-10-CM

## 2011-01-16 DIAGNOSIS — K439 Ventral hernia without obstruction or gangrene: Secondary | ICD-10-CM

## 2011-01-16 MED ORDER — FLUTICASONE-SALMETEROL 250-50 MCG/DOSE IN AEPB: 1.0000 | INHALATION_SPRAY | Freq: Two times a day (BID) | RESPIRATORY_TRACT | Status: DC

## 2011-01-16 NOTE — Assessment & Plan Note (Signed)
Start spiriva

## 2011-01-16 NOTE — Assessment & Plan Note (Signed)
This has been repaired by Dr. Dwain Sarna

## 2011-01-16 NOTE — Progress Notes (Signed)
Subjective:    Patient ID: Christian Hurley, male    DOB: 1940-06-18, 70 y.o.   MRN: [id number]  Cough This is a chronic problem. The current episode started more than 1 year ago. The problem has been unchanged. The problem occurs every few hours. The cough is non-productive. Pertinent negatives include no chest pain, chills, ear congestion, ear pain, eye redness, fever, headaches, heartburn, hemoptysis, myalgias, nasal congestion, postnasal drip, rash, rhinorrhea, sore throat, shortness of breath, sweats or wheezing. The symptoms are aggravated by nothing. He has tried nothing for the symptoms. His past medical history is significant for COPD.      Review of Systems  Constitutional: Negative for fever, chills, diaphoresis, activity change, appetite change, fatigue and unexpected weight change.  HENT: Negative for ear pain, nosebleeds, congestion, sore throat, facial swelling, rhinorrhea, trouble swallowing, neck pain, neck stiffness, voice change, postnasal drip and sinus pressure.   Eyes: Negative for photophobia, redness and visual disturbance.  Respiratory: Positive for cough. Negative for apnea, hemoptysis, choking, chest tightness, shortness of breath, wheezing and stridor.   Cardiovascular: Negative for chest pain, palpitations and leg swelling.  Gastrointestinal: Negative for heartburn, nausea, abdominal pain, diarrhea, constipation, blood in stool and abdominal distention.  Genitourinary: Negative for dysuria, urgency, frequency, hematuria, decreased urine volume, enuresis and difficulty urinating.  Musculoskeletal: Negative for myalgias, back pain, joint swelling, arthralgias and gait problem.  Skin: Negative for color change, pallor, rash and wound.  Neurological: Negative for dizziness, tremors, seizures, syncope, facial asymmetry, speech difficulty, weakness, light-headedness, numbness and headaches.  Hematological: Negative for adenopathy. Does not bruise/bleed easily.    Psychiatric/Behavioral: Negative.        Objective:   Physical Exam  Vitals reviewed. Constitutional: He is oriented to person, place, and time. He appears well-developed and well-nourished. No distress.  HENT:  Head: Normocephalic and atraumatic.  Right Ear: External ear normal.  Left Ear: External ear normal.  Nose: Nose normal.  Mouth/Throat: Oropharynx is clear and moist. No oropharyngeal exudate.  Eyes: Conjunctivae and EOM are normal. Pupils are equal, round, and reactive to light. Right eye exhibits no discharge. Left eye exhibits no discharge. No scleral icterus.  Neck: Normal range of motion. Neck supple. No JVD present. No tracheal deviation present. No thyromegaly present.  Cardiovascular: Normal rate, regular rhythm, normal heart sounds and intact distal pulses.  Exam reveals no gallop and no friction rub.   No murmur heard. Pulmonary/Chest: Effort normal and breath sounds normal. No stridor. No respiratory distress. He has no wheezes. He has no rales. He exhibits no tenderness.  Abdominal: Soft. Normal appearance and bowel sounds are normal. He exhibits no distension and no mass. There is no hepatosplenomegaly. There is no tenderness. There is no rigidity, no rebound, no guarding, no CVA tenderness, no tenderness at McBurney's point and negative Murphy's sign. Hernia confirmed negative in the right inguinal area and confirmed negative in the left inguinal area.         All surgical wound sights look good with some scab but no warmth, exudate, ttp, streaking, fb  Musculoskeletal: Normal range of motion. He exhibits no edema and no tenderness.  Lymphadenopathy:    He has no cervical adenopathy.  Neurological: He is alert and oriented to person, place, and time. He has normal reflexes. No cranial nerve deficit.  Skin: Skin is warm and dry. No rash noted. He is not diaphoretic. No erythema. No pallor.  Psychiatric: He has a normal mood and affect. His behavior is normal.  Judgment and thought content normal.      Lab Results  Component Value Date   WBC 5.7 12/20/2010   HGB 10.4* 12/20/2010   HCT 30.1* 12/20/2010   PLT 162 12/20/2010   CHOL 156 10/13/2010   TRIG 120.0 10/13/2010   HDL 43.30 10/13/2010   ALT 24 10/13/2010   AST 21 10/13/2010   NA 136 12/24/2010   K 3.5 12/24/2010   CL 102 12/24/2010   CREATININE 0.74 12/24/2010   BUN 14 12/24/2010   CO2 30 12/24/2010   TSH 1.59 10/13/2010   PSA 0.60 10/13/2010      Assessment & Plan:

## 2011-01-23 ENCOUNTER — Ambulatory Visit (INDEPENDENT_AMBULATORY_CARE_PROVIDER_SITE_OTHER): Payer: Medicare Other | Admitting: General Surgery

## 2011-01-23 ENCOUNTER — Encounter (INDEPENDENT_AMBULATORY_CARE_PROVIDER_SITE_OTHER): Payer: Self-pay | Admitting: General Surgery

## 2011-01-23 ENCOUNTER — Encounter (INDEPENDENT_AMBULATORY_CARE_PROVIDER_SITE_OTHER): Payer: Medicare Other | Admitting: General Surgery

## 2011-01-23 DIAGNOSIS — Z09 Encounter for follow-up examination after completed treatment for conditions other than malignant neoplasm: Secondary | ICD-10-CM

## 2011-01-23 NOTE — Progress Notes (Signed)
Subjective:     Patient ID: Christian Hurley, male   DOB: [birth year], 70 y.o.   MRN: [id number]  HPI This is a 70 year old male who underwent an open repair of a subxiphoid hernia with component separation and mesh. He's done well and he returns today just another postoperative check. I took out his drains at his last visit. He is having bowel movements E. well. He has some pain is managed with ibuprofen. He reports no complaints today.  Review of Systems     Objective:   Physical Exam Well healed incision and drainage sites, no infection    Assessment:     S/p open ventral hernia repair of subxyphoid hernia with underlay physiomesh and component sepration    Plan:         He is doing well but I asked him to still not lift over 20 pounds for the next couple of months. I will see him in 2 months.

## 2011-02-24 LAB — COMPREHENSIVE METABOLIC PANEL
ALT: 14
BUN: 13
CO2: 26
Calcium: 8.9
GFR calc non Af Amer: >60
Glucose, Bld: 123 — ABNORMAL HIGH
Sodium: 134 — ABNORMAL LOW

## 2011-02-24 LAB — LIPASE, BLOOD: Lipase: 18

## 2011-03-06 ENCOUNTER — Ambulatory Visit (INDEPENDENT_AMBULATORY_CARE_PROVIDER_SITE_OTHER): Payer: Medicare Other | Admitting: General Surgery

## 2011-03-06 ENCOUNTER — Encounter (INDEPENDENT_AMBULATORY_CARE_PROVIDER_SITE_OTHER): Payer: Self-pay | Admitting: General Surgery

## 2011-03-06 VITALS — BP 150/80 | HR 78 | Temp 98.6°F | Resp 16 | Ht 70.0 in | Wt 191.0 lb

## 2011-03-06 DIAGNOSIS — Z09 Encounter for follow-up examination after completed treatment for conditions other than malignant neoplasm: Secondary | ICD-10-CM

## 2011-03-06 NOTE — Progress Notes (Signed)
Chief Complaint  Patient presents with  . Follow-up    Recheck ventral hernia repair    HPI Christian Hurley is a 70 y.o. male.   HPI This is a 70 year old male who I did an open abdominal repair of the subxiphoid hernia with a component separation. He has done very well postoperatively and returned today to be released to most of his normal activities. He reports no complaints. He is back to most of his normal activities at this point. He is eating well and has normal bowel function. Past Medical History  Diagnosis Date  . Emphysema of lung   . Depression   . Hernia 2007,1970,1976,1996,2000  . Allergy   . Cataract   . Neuromuscular disorder     Past Surgical History  Procedure Date  . Hemorrhoid removed   . Bladder stretch   . Hernia repair     1974  . Hernia repair 1992  . Hernia repair 1996  . Hernia repair 2007  . Hernia repair 2012    subxyphoid hernia with component separation    Family History  Problem Relation Age of Onset  . Arthritis Mother   . Cancer Neg Hx   . Heart disease Neg Hx   . Hyperlipidemia Neg Hx   . Hypertension Neg Hx   . Stroke Neg Hx   . Kidney disease Father   . Diabetes Sister     Social History History  Substance Use Topics  . Smoking status: Former Smoker -- 1.5 packs/day for 40 years    Quit date: 10/12/2005  . Smokeless tobacco: Not on file  . Alcohol Use: No    Allergies  Allergen Reactions  . Macrobid     Current Outpatient Prescriptions  Medication Sig Dispense Refill  . aspirin 81 MG tablet Take 81 mg by mouth daily.        . divalproex (DEPAKOTE) 500 MG 24 hr tablet Take 500 mg by mouth daily.        . Fluticasone-Salmeterol (ADVAIR DISKUS) 250-50 MCG/DOSE AEPB Inhale 1 puff into the lungs 2 (two) times daily.  5 each  0  . fluvoxaMINE (LUVOX) 100 MG tablet Take 100 mg by mouth 2 (two) times daily.       Marland Kitchen HYDROcodone-acetaminophen (NORCO) 10-325 MG per tablet Take 1-2 tablets by mouth every 4 (four) hours as needed  for pain.  40 tablet  0  . ibuprofen (ADVIL,MOTRIN) 200 MG tablet Take 400 mg by mouth daily.         Current Facility-Administered Medications  Medication Dose Route Frequency Provider Last Rate Last Dose  . pneumococcal 23 valent vaccine (PNU-IMMUNE) injection 0.5 mL  0.5 mL Intramuscular Once Etta Grandchild, MD        Review of Systems Review of Systems  Blood pressure 150/80, pulse 78, temperature 98.6 F (37 C), temperature source Temporal, resp. rate 16, height 5\' 10"  (1.778 m), weight 191 lb (86.637 kg).  Physical Exam Physical Exam  Constitutional: He appears well-developed and well-nourished.  Abdominal: Soft. Bowel sounds are normal. There is no tenderness. No hernia.        Assessment    S/p open ventral hernia repair with mesh and component separation    Plan       He's really done well after his hernia repair. His abdominal wall is nice and tight. I told him I would release him to all of his normal activities in that he can increase them as tolerated. I would  prefer him not to do any heavy lifting from here on out and he understands this. He is going to come back and see me as needed.    Judea Riches 03/06/2011, 9:50 AM

## 2011-03-20 ENCOUNTER — Encounter: Payer: Self-pay | Admitting: Internal Medicine

## 2011-03-20 ENCOUNTER — Ambulatory Visit (INDEPENDENT_AMBULATORY_CARE_PROVIDER_SITE_OTHER): Payer: Medicare Other | Admitting: Internal Medicine

## 2011-03-20 VITALS — BP 120/70 | HR 77 | Temp 97.2°F | Resp 16 | Wt 193.0 lb

## 2011-03-20 DIAGNOSIS — Z23 Encounter for immunization: Secondary | ICD-10-CM

## 2011-03-20 DIAGNOSIS — J449 Chronic obstructive pulmonary disease, unspecified: Secondary | ICD-10-CM

## 2011-03-20 DIAGNOSIS — J4489 Other specified chronic obstructive pulmonary disease: Secondary | ICD-10-CM

## 2011-03-20 NOTE — Assessment & Plan Note (Addendum)
He is doing well will continue current meds

## 2011-03-20 NOTE — Progress Notes (Signed)
  Subjective:    Patient ID: Christian Hurley, male    DOB: Nov 19, 1940, 70 y.o.   MRN: [id number]  HPI He returns for f/up on his COPD and he tells me that he is feeling well with no cough or SOB.   Review of Systems  Constitutional: Negative for fever, chills, diaphoresis, activity change, appetite change, fatigue and unexpected weight change.  HENT: Negative.   Eyes: Negative.   Respiratory: Negative for apnea, cough, choking, chest tightness, shortness of breath, wheezing and stridor.   Cardiovascular: Negative for chest pain, palpitations and leg swelling.  Gastrointestinal: Negative for nausea, vomiting, abdominal pain, diarrhea, constipation, blood in stool, abdominal distention, anal bleeding and rectal pain.  Genitourinary: Negative.   Musculoskeletal: Negative for myalgias, back pain, joint swelling, arthralgias and gait problem.  Skin: Negative for color change, pallor, rash and wound.  Neurological: Negative for dizziness, tremors, seizures, syncope, facial asymmetry, speech difficulty, weakness, light-headedness, numbness and headaches.  Hematological: Negative.   Psychiatric/Behavioral: Negative.        Objective:   Physical Exam  Vitals reviewed. Constitutional: He is oriented to person, place, and time. He appears well-developed and well-nourished. No distress.  HENT:  Head: Normocephalic and atraumatic.  Mouth/Throat: Oropharynx is clear and moist. No oropharyngeal exudate.  Eyes: Conjunctivae are normal. Right eye exhibits no discharge. Left eye exhibits no discharge. No scleral icterus.  Neck: Normal range of motion. Neck supple. No JVD present. No tracheal deviation present. No thyromegaly present.  Cardiovascular: Normal rate, regular rhythm, normal heart sounds and intact distal pulses.  Exam reveals no gallop and no friction rub.   No murmur heard. Pulmonary/Chest: Effort normal and breath sounds normal. No stridor. No respiratory distress. He has no wheezes. He  has no rales. He exhibits no tenderness.  Abdominal: Soft. Bowel sounds are normal. He exhibits no distension and no mass. There is no tenderness. There is no rebound and no guarding.  Musculoskeletal: Normal range of motion. He exhibits no edema and no tenderness.  Lymphadenopathy:    He has no cervical adenopathy.  Neurological: He is oriented to person, place, and time. He displays normal reflexes. He exhibits normal muscle tone. Coordination normal.  Skin: Skin is warm and dry. No rash noted. He is not diaphoretic. No erythema. No pallor.  Psychiatric: He has a normal mood and affect. His behavior is normal. Judgment and thought content normal.      Lab Results  Component Value Date   WBC 5.7 12/20/2010   HGB 10.4* 12/20/2010   HCT 30.1* 12/20/2010   PLT 162 12/20/2010   GLUCOSE 101* 12/24/2010   CHOL 156 10/13/2010   TRIG 120.0 10/13/2010   HDL 43.30 10/13/2010   LDLCALC 89 10/13/2010   ALT 24 10/13/2010   AST 21 10/13/2010   NA 136 12/24/2010   K 3.5 12/24/2010   CL 102 12/24/2010   CREATININE 0.74 12/24/2010   BUN 14 12/24/2010   CO2 30 12/24/2010   TSH 1.59 10/13/2010   PSA 0.60 10/13/2010      Assessment & Plan:

## 2011-07-24 ENCOUNTER — Ambulatory Visit: Payer: Medicare Other | Admitting: Internal Medicine

## 2011-07-31 ENCOUNTER — Ambulatory Visit (INDEPENDENT_AMBULATORY_CARE_PROVIDER_SITE_OTHER): Payer: Medicare Other | Admitting: Internal Medicine

## 2011-07-31 ENCOUNTER — Other Ambulatory Visit (INDEPENDENT_AMBULATORY_CARE_PROVIDER_SITE_OTHER): Payer: Medicare Other

## 2011-07-31 ENCOUNTER — Encounter: Payer: Self-pay | Admitting: Internal Medicine

## 2011-07-31 VITALS — BP 118/60 | HR 69 | Temp 98.0°F | Resp 16 | Wt 193.4 lb

## 2011-07-31 DIAGNOSIS — F319 Bipolar disorder, unspecified: Secondary | ICD-10-CM

## 2011-07-31 DIAGNOSIS — Z79899 Other long term (current) drug therapy: Secondary | ICD-10-CM

## 2011-07-31 DIAGNOSIS — J4489 Other specified chronic obstructive pulmonary disease: Secondary | ICD-10-CM

## 2011-07-31 DIAGNOSIS — R7309 Other abnormal glucose: Secondary | ICD-10-CM

## 2011-07-31 DIAGNOSIS — J449 Chronic obstructive pulmonary disease, unspecified: Secondary | ICD-10-CM

## 2011-07-31 LAB — CBC WITH DIFFERENTIAL/PLATELET
Basophils Absolute: 0 10*3/uL (ref 0.0–0.1)
Eosinophils Absolute: 0.3 10*3/uL (ref 0.0–0.7)
HCT: 39.8 % (ref 39.0–52.0)
Hemoglobin: 13.4 g/dL (ref 13.0–17.0)
Lymphs Abs: 2.1 10*3/uL (ref 0.7–4.0)
MCHC: 33.7 g/dL (ref 30.0–36.0)
MCV: 94.5 fl (ref 78.0–100.0)
Neutro Abs: 4.6 10*3/uL (ref 1.4–7.7)
RDW: 13.6 % (ref 11.5–14.6)

## 2011-07-31 LAB — COMPREHENSIVE METABOLIC PANEL
ALT: 17 U/L (ref 0–53)
AST: 14 U/L (ref 0–37)
Creatinine, Ser: 1.1 mg/dL (ref 0.4–1.5)
Total Bilirubin: 0.9 mg/dL (ref 0.3–1.2)

## 2011-07-31 LAB — BASIC METABOLIC PANEL
BUN: 17 mg/dL (ref 6–23)
Creatinine, Ser: 1.1 mg/dL (ref 0.4–1.5)
GFR: 68.44 mL/min (ref >60.00)
Potassium: 4.7 mEq/L (ref 3.5–5.1)

## 2011-07-31 LAB — HEMOGLOBIN A1C: Hgb A1c MFr Bld: 5.4 % (ref 4.6–6.5)

## 2011-07-31 NOTE — Progress Notes (Signed)
Subjective:    Patient ID: Christian Hurley, male    DOB: 04/21/1941, 71 y.o.   MRN: [id number]  Asthma There is no chest tightness, cough, difficulty breathing, frequent throat clearing, hemoptysis, hoarse voice, shortness of breath, sputum production or wheezing. This is a chronic problem. The current episode started more than 1 year ago. The problem occurs rarely. The problem has been gradually improving. Pertinent negatives include no appetite change, chest pain, dyspnea on exertion, ear congestion, ear pain, fever, headaches, heartburn, malaise/fatigue, myalgias, nasal congestion, orthopnea, PND, postnasal drip, rhinorrhea, sneezing, sore throat, sweats, trouble swallowing or weight loss. His symptoms are aggravated by change in weather. His symptoms are alleviated by steroid inhaler and beta-agonist. He reports complete improvement on treatment. His past medical history is significant for asthma and COPD.      Review of Systems  Constitutional: Negative for fever, chills, weight loss, malaise/fatigue, diaphoresis, activity change, appetite change, fatigue and unexpected weight change.  HENT: Negative.  Negative for ear pain, sore throat, hoarse voice, rhinorrhea, sneezing, trouble swallowing and postnasal drip.   Eyes: Negative.   Respiratory: Negative for cough, hemoptysis, sputum production, choking, chest tightness, shortness of breath, wheezing and stridor.   Cardiovascular: Negative for chest pain, dyspnea on exertion, palpitations, leg swelling and PND.  Gastrointestinal: Negative for heartburn, nausea, vomiting, abdominal pain, diarrhea, constipation, blood in stool, abdominal distention and anal bleeding.  Genitourinary: Negative.   Musculoskeletal: Negative for myalgias, back pain, joint swelling, arthralgias and gait problem.  Skin: Negative for color change, pallor, rash and wound.  Neurological: Negative for dizziness, tremors, seizures, syncope, facial asymmetry, speech  difficulty, weakness, light-headedness, numbness and headaches.  Hematological: Negative for adenopathy. Does not bruise/bleed easily.  Psychiatric/Behavioral: Negative for suicidal ideas, hallucinations, behavioral problems, confusion, sleep disturbance, self-injury, dysphoric mood, decreased concentration and agitation. The patient is not nervous/anxious and is not hyperactive.        Objective:   Physical Exam  Vitals reviewed. Constitutional: He is oriented to person, place, and time. He appears well-developed and well-nourished. No distress.  HENT:  Head: Normocephalic and atraumatic.  Mouth/Throat: Oropharynx is clear and moist. No oropharyngeal exudate.  Eyes: Conjunctivae are normal. Right eye exhibits no discharge. No scleral icterus.  Cardiovascular: Normal rate, regular rhythm, normal heart sounds and intact distal pulses.  Exam reveals no gallop and no friction rub.   No murmur heard. Pulmonary/Chest: Effort normal and breath sounds normal. No respiratory distress. He has no wheezes. He has no rales. He exhibits no tenderness.  Abdominal: Soft. Bowel sounds are normal. He exhibits no distension and no mass. There is no tenderness. There is no rebound and no guarding.  Musculoskeletal: Normal range of motion. He exhibits no edema and no tenderness.  Neurological: He is oriented to person, place, and time.  Skin: Skin is warm and dry. No rash noted. He is not diaphoretic. No erythema. No pallor.  Psychiatric: He has a normal mood and affect. Judgment and thought content normal. His mood appears not anxious. His affect is not angry, not blunt, not labile and not inappropriate. His speech is delayed and tangential. His speech is not rapid and/or pressured and not slurred. He is slowed. He is not agitated, not aggressive, is not hyperactive, not withdrawn, not actively hallucinating and not combative. Thought content is not paranoid. Cognition and memory are impaired. He does not express  impulsivity or inappropriate judgment. He does not exhibit a depressed mood. He expresses no homicidal and no suicidal ideation. He expresses  no suicidal plans and no homicidal plans. He is communicative. He exhibits abnormal recent memory and abnormal remote memory. He is inattentive.      Lab Results  Component Value Date   WBC 5.7 12/20/2010   HGB 10.4* 12/20/2010   HCT 30.1* 12/20/2010   PLT 162 12/20/2010   GLUCOSE 101* 12/24/2010   CHOL 156 10/13/2010   TRIG 120.0 10/13/2010   HDL 43.30 10/13/2010   LDLCALC 89 10/13/2010   ALT 24 10/13/2010   AST 21 10/13/2010   NA 136 12/24/2010   K 3.5 12/24/2010   CL 102 12/24/2010   CREATININE 0.74 12/24/2010   BUN 14 12/24/2010   CO2 30 12/24/2010   TSH 1.59 10/13/2010   PSA 0.60 10/13/2010      Assessment & Plan:

## 2011-07-31 NOTE — Assessment & Plan Note (Signed)
He is doing well on advair diskus

## 2011-07-31 NOTE — Assessment & Plan Note (Signed)
He is doing very well, I will check his valproic level today

## 2011-07-31 NOTE — Patient Instructions (Signed)

## 2011-07-31 NOTE — Assessment & Plan Note (Signed)
I will check his a1c today 

## 2011-11-27 ENCOUNTER — Ambulatory Visit: Payer: Medicare Other | Admitting: Internal Medicine

## 2012-02-29 ENCOUNTER — Ambulatory Visit (INDEPENDENT_AMBULATORY_CARE_PROVIDER_SITE_OTHER): Payer: Medicare Other

## 2012-02-29 DIAGNOSIS — Z23 Encounter for immunization: Secondary | ICD-10-CM

## 2012-07-01 ENCOUNTER — Ambulatory Visit (INDEPENDENT_AMBULATORY_CARE_PROVIDER_SITE_OTHER): Payer: Medicare Other | Admitting: Internal Medicine

## 2012-07-01 ENCOUNTER — Ambulatory Visit (INDEPENDENT_AMBULATORY_CARE_PROVIDER_SITE_OTHER)
Admission: RE | Admit: 2012-07-01 | Discharge: 2012-07-01 | Disposition: A | Discharge status: Home / Self Care | Payer: Medicare Other | Source: Ambulatory Visit | Attending: Internal Medicine | Admitting: Internal Medicine

## 2012-07-01 ENCOUNTER — Encounter: Payer: Self-pay | Admitting: Internal Medicine

## 2012-07-01 VITALS — BP 130/80 | HR 77 | Temp 98.0°F | Resp 22 | Wt 202.2 lb

## 2012-07-01 DIAGNOSIS — N400 Enlarged prostate without lower urinary tract symptoms: Secondary | ICD-10-CM

## 2012-07-01 DIAGNOSIS — J209 Acute bronchitis, unspecified: Secondary | ICD-10-CM

## 2012-07-01 DIAGNOSIS — R05 Cough: Secondary | ICD-10-CM

## 2012-07-01 DIAGNOSIS — F319 Bipolar disorder, unspecified: Secondary | ICD-10-CM

## 2012-07-01 DIAGNOSIS — Z0000 Encounter for general adult medical examination without abnormal findings: Secondary | ICD-10-CM

## 2012-07-01 DIAGNOSIS — Z79899 Other long term (current) drug therapy: Secondary | ICD-10-CM

## 2012-07-01 MED ORDER — DIVALPROEX SODIUM ER 500 MG PO TB24: 500.0000 mg | ORAL_TABLET | Freq: Every day | ORAL | Status: DC

## 2012-07-01 MED ORDER — AZITHROMYCIN 500 MG PO TABS: 500.0000 mg | ORAL_TABLET | Freq: Every day | ORAL | Status: DC

## 2012-07-01 MED ORDER — FLUVOXAMINE MALEATE 100 MG PO TABS: 100.0000 mg | ORAL_TABLET | Freq: Two times a day (BID) | ORAL | Status: AC

## 2012-07-01 MED ORDER — HYDROCODONE-HOMATROPINE 5-1.5 MG/5ML PO SYRP: 5.0000 mL | ORAL_SOLUTION | Freq: Three times a day (TID) | ORAL | Status: DC | PRN

## 2012-07-01 NOTE — Progress Notes (Signed)
Subjective:    Patient ID: Christian Hurley, male    DOB: 04-14-1941, 72 y.o.   MRN: [id number]  Cough This is a new problem. The current episode started 1 to 4 weeks ago. The problem has been gradually worsening. The problem occurs every few hours. The cough is non-productive. Associated symptoms include chills and shortness of breath. Pertinent negatives include no chest pain, ear congestion, ear pain, fever, headaches, heartburn, hemoptysis, myalgias, nasal congestion, postnasal drip, rash, rhinorrhea, sore throat, sweats, weight loss or wheezing. The symptoms are aggravated by cold air. He has tried nothing (he has not been using the advair diskus) for the symptoms. The treatment provided no relief. His past medical history is significant for COPD. There is no history of bronchitis or pneumonia.      Review of Systems  Constitutional: Positive for chills and fatigue. Negative for fever, weight loss, diaphoresis, activity change, appetite change and unexpected weight change.  HENT: Negative for ear pain, sore throat, facial swelling, rhinorrhea, trouble swallowing, voice change, postnasal drip and sinus pressure.   Eyes: Negative.   Respiratory: Positive for cough and shortness of breath. Negative for apnea, hemoptysis, choking, chest tightness, wheezing and stridor.   Cardiovascular: Negative for chest pain, palpitations and leg swelling.  Gastrointestinal: Negative for heartburn, nausea, vomiting, abdominal pain, diarrhea, constipation and blood in stool.  Genitourinary: Negative for dysuria, urgency, hematuria, flank pain, decreased urine volume, enuresis and difficulty urinating.  Musculoskeletal: Negative for myalgias, back pain, joint swelling, arthralgias and gait problem.  Skin: Negative for color change, pallor, rash and wound.  Neurological: Negative for dizziness, weakness, light-headedness and headaches.  Hematological: Negative for adenopathy. Does not bruise/bleed easily.    Psychiatric/Behavioral: Positive for dysphoric mood. Negative for suicidal ideas, hallucinations, behavioral problems, confusion, sleep disturbance, self-injury, decreased concentration and agitation. The patient is not nervous/anxious and is not hyperactive.        Objective:   Physical Exam  Vitals reviewed. Constitutional: He is oriented to person, place, and time. He appears well-developed and well-nourished.  Non-toxic appearance. He does not have a sickly appearance. He does not appear ill. No distress.  HENT:  Head: Normocephalic and atraumatic.  Mouth/Throat: Oropharynx is clear and moist. No oropharyngeal exudate.  Eyes: Conjunctivae normal are normal. Right eye exhibits no discharge. Left eye exhibits no discharge. No scleral icterus.  Neck: Normal range of motion. Neck supple. No tracheal deviation present. No thyromegaly present.  Cardiovascular: Normal rate, regular rhythm, normal heart sounds and intact distal pulses.  Exam reveals no gallop and no friction rub.   No murmur heard. Pulmonary/Chest: Effort normal. No accessory muscle usage or stridor. Not tachypneic. No respiratory distress. He has no decreased breath sounds. He has no wheezes. He has no rhonchi. He has rales in the right lower field and the left lower field. He exhibits no tenderness.  Abdominal: Soft. Bowel sounds are normal. He exhibits no distension and no mass. There is no tenderness. There is no rebound and no guarding.  Musculoskeletal: Normal range of motion. He exhibits no edema and no tenderness.  Lymphadenopathy:    He has no cervical adenopathy.  Neurological: He is oriented to person, place, and time.  Skin: Skin is warm and dry. No rash noted. He is not diaphoretic. No erythema. No pallor.  Psychiatric: Judgment and thought content normal. His mood appears not anxious. His affect is not blunt, not labile and not inappropriate. His speech is delayed. His speech is not rapid and/or pressured, not  tangential and not slurred. He is slowed and withdrawn. He is not agitated, not aggressive, is not hyperactive, not actively hallucinating and not combative. Cognition and memory are normal. He exhibits a depressed mood. He is communicative. He is inattentive.      Lab Results  Component Value Date   WBC 8.2 07/31/2011   HGB 13.4 07/31/2011   HCT 39.8 07/31/2011   PLT 236.0 07/31/2011   GLUCOSE 88 07/31/2011   GLUCOSE 88 07/31/2011   CHOL 156 10/13/2010   TRIG 120.0 10/13/2010   HDL 43.30 10/13/2010   LDLCALC 89 10/13/2010   ALT 17 07/31/2011   AST 14 07/31/2011   NA 139 07/31/2011   NA 139 07/31/2011   K 4.7 07/31/2011   K 4.7 07/31/2011   CL 105 07/31/2011   CL 105 07/31/2011   CREATININE 1.1 07/31/2011   CREATININE 1.1 07/31/2011   BUN 17 07/31/2011   BUN 17 07/31/2011   CO2 29 07/31/2011   CO2 29 07/31/2011   TSH 1.59 10/13/2010   PSA 0.60 10/13/2010   HGBA1C 5.4 07/31/2011      Assessment & Plan:

## 2012-07-01 NOTE — Assessment & Plan Note (Signed)
I will check his CXR to see if he has PNA, mass, or edema

## 2012-07-01 NOTE — Patient Instructions (Signed)

## 2012-07-01 NOTE — Assessment & Plan Note (Signed)
He will start zpak for the infection and a cough suppressant

## 2012-07-01 NOTE — Assessment & Plan Note (Signed)
His psych in [place] has refused to refill his meds so I did that today Also, he wants to see a psych here in [place] so I made a referral for that as well

## 2012-07-17 ENCOUNTER — Ambulatory Visit: Payer: Medicare Other | Admitting: Internal Medicine

## 2012-07-31 ENCOUNTER — Other Ambulatory Visit (INDEPENDENT_AMBULATORY_CARE_PROVIDER_SITE_OTHER): Payer: Medicare Other

## 2012-07-31 ENCOUNTER — Ambulatory Visit (INDEPENDENT_AMBULATORY_CARE_PROVIDER_SITE_OTHER): Payer: Medicare Other | Admitting: Internal Medicine

## 2012-07-31 ENCOUNTER — Encounter: Payer: Self-pay | Admitting: Internal Medicine

## 2012-07-31 VITALS — BP 140/78 | HR 80 | Temp 98.1°F | Resp 16 | Wt 203.0 lb

## 2012-07-31 DIAGNOSIS — R7309 Other abnormal glucose: Secondary | ICD-10-CM

## 2012-07-31 DIAGNOSIS — F319 Bipolar disorder, unspecified: Secondary | ICD-10-CM

## 2012-07-31 DIAGNOSIS — Z0000 Encounter for general adult medical examination without abnormal findings: Secondary | ICD-10-CM

## 2012-07-31 DIAGNOSIS — Z79899 Other long term (current) drug therapy: Secondary | ICD-10-CM

## 2012-07-31 DIAGNOSIS — N400 Enlarged prostate without lower urinary tract symptoms: Secondary | ICD-10-CM

## 2012-07-31 LAB — URINALYSIS, ROUTINE W REFLEX MICROSCOPIC
Hgb urine dipstick: NEGATIVE
Nitrite: NEGATIVE
Specific Gravity, Urine: >=1.03 (ref 1.000–1.030)
Total Protein, Urine: NEGATIVE
Urobilinogen, UA: 0.2 (ref 0.0–1.0)

## 2012-07-31 LAB — LIPID PANEL
LDL Cholesterol: 99 mg/dL (ref 0–99)
Total CHOL/HDL Ratio: 3
Triglycerides: 62 mg/dL (ref 0.0–149.0)

## 2012-07-31 LAB — COMPREHENSIVE METABOLIC PANEL
AST: 20 U/L (ref 0–37)
Albumin: 3.7 g/dL (ref 3.5–5.2)
Alkaline Phosphatase: 77 U/L (ref 39–117)
Potassium: 4.9 mEq/L (ref 3.5–5.1)
Sodium: 143 mEq/L (ref 135–145)
Total Protein: 6.5 g/dL (ref 6.0–8.3)

## 2012-07-31 LAB — CBC WITH DIFFERENTIAL/PLATELET
Eosinophils Absolute: 0.4 10*3/uL (ref 0.0–0.7)
MCHC: 33.5 g/dL (ref 30.0–36.0)
MCV: 93.6 fl (ref 78.0–100.0)
Monocytes Absolute: 0.7 10*3/uL (ref 0.1–1.0)
Neutrophils Relative %: 43.4 % (ref 43.0–77.0)
Platelets: 235 10*3/uL (ref 150.0–400.0)
WBC: 5.3 10*3/uL (ref 4.5–10.5)

## 2012-07-31 NOTE — Progress Notes (Signed)
Subjective:    Patient ID: Christian Hurley, male    DOB: March 14, 1941, 72 y.o.   MRN: [id number]  HPI  He returns for a physical - he tells me that he feels well and offers no complaints.   Review of Systems  Constitutional: Negative for fever, chills, diaphoresis, activity change, appetite change, fatigue and unexpected weight change.  HENT: Negative.   Eyes: Negative.   Respiratory: Negative for cough, chest tightness, shortness of breath, wheezing and stridor.   Cardiovascular: Negative for chest pain, palpitations and leg swelling.  Gastrointestinal: Negative for nausea, vomiting, abdominal pain, diarrhea, constipation and blood in stool.  Endocrine: Negative.   Genitourinary: Negative.  Negative for urgency, frequency, hematuria, flank pain, decreased urine volume, enuresis and difficulty urinating.  Musculoskeletal: Negative.   Skin: Negative.   Allergic/Immunologic: Negative.   Neurological: Negative for dizziness, tremors, syncope, weakness, light-headedness and headaches.  Hematological: Negative for adenopathy. Does not bruise/bleed easily.  Psychiatric/Behavioral: Negative.        Objective:   Physical Exam  Vitals reviewed. Constitutional: He is oriented to person, place, and time. He appears well-developed and well-nourished. No distress.  HENT:  Head: Normocephalic and atraumatic.  Mouth/Throat: Oropharynx is clear and moist. No oropharyngeal exudate.  Eyes: Conjunctivae are normal. Right eye exhibits no discharge. Left eye exhibits no discharge. No scleral icterus.  Neck: Normal range of motion. Neck supple. No JVD present. No tracheal deviation present. No thyromegaly present.  Cardiovascular: Normal rate, regular rhythm, normal heart sounds and intact distal pulses.  Exam reveals no gallop and no friction rub.   No murmur heard. Pulmonary/Chest: Effort normal and breath sounds normal. No stridor. No respiratory distress. He has no wheezes. He has no rales. He  exhibits no tenderness.  Abdominal: Soft. Bowel sounds are normal. He exhibits no distension and no mass. There is no tenderness. There is no rebound and no guarding. Hernia confirmed negative in the right inguinal area and confirmed negative in the left inguinal area.  Genitourinary: Rectum normal, prostate normal, testes normal and penis normal. Rectal exam shows no external hemorrhoid, no internal hemorrhoid, no fissure, no mass, no tenderness and anal tone normal. Guaiac negative stool. Prostate is not enlarged and not tender. Right testis shows no mass, no swelling and no tenderness. Right testis is descended. Left testis shows no mass, no swelling and no tenderness. Left testis is descended. Circumcised. No penile erythema or penile tenderness. No discharge found.  Musculoskeletal: Normal range of motion. He exhibits no edema and no tenderness.  Lymphadenopathy:    He has no cervical adenopathy.       Right: No inguinal adenopathy present.       Left: No inguinal adenopathy present.  Neurological: He is oriented to person, place, and time.  Skin: Skin is warm and dry. No rash noted. He is not diaphoretic. No erythema. No pallor.  Psychiatric: He has a normal mood and affect. His behavior is normal. Judgment and thought content normal.      Lab Results  Component Value Date   WBC 8.2 07/31/2011   HGB 13.4 07/31/2011   HCT 39.8 07/31/2011   PLT 236.0 07/31/2011   GLUCOSE 88 07/31/2011   GLUCOSE 88 07/31/2011   CHOL 156 10/13/2010   TRIG 120.0 10/13/2010   HDL 43.30 10/13/2010   LDLCALC 89 10/13/2010   ALT 17 07/31/2011   AST 14 07/31/2011   NA 139 07/31/2011   NA 139 07/31/2011   K 4.7 07/31/2011   K  4.7 07/31/2011   CL 105 07/31/2011   CL 105 07/31/2011   CREATININE 1.1 07/31/2011   CREATININE 1.1 07/31/2011   BUN 17 07/31/2011   BUN 17 07/31/2011   CO2 29 07/31/2011   CO2 29 07/31/2011   TSH 1.59 10/13/2010   PSA 0.60 10/13/2010   HGBA1C 5.4 07/31/2011      Assessment & Plan:

## 2012-07-31 NOTE — Patient Instructions (Signed)
Health Maintenance, Males A healthy lifestyle and preventative care can promote health and wellness.  Maintain regular health, dental, and eye exams.  Eat a healthy diet. Foods like vegetables, fruits, whole grains, low-fat dairy products, and lean protein foods contain the nutrients you need without too many calories. Decrease your intake of foods high in solid fats, added sugars, and salt. Get information about a proper diet from your caregiver, if necessary.  Regular physical exercise is one of the most important things you can do for your health. Most adults should get at least 150 minutes of moderate-intensity exercise (any activity that increases your heart rate and causes you to sweat) each week. In addition, most adults need muscle-strengthening exercises on 2 or more days a week.   Maintain a healthy weight. The body mass index (BMI) is a screening tool to identify possible weight problems. It provides an estimate of body fat based on height and weight. Your caregiver can help determine your BMI, and can help you achieve or maintain a healthy weight. For adults 20 years and older:  A BMI below 18.5 is considered underweight.  A BMI of 18.5 to 24.9 is normal.  A BMI of 25 to 29.9 is considered overweight.  A BMI of 30 and above is considered obese.  Maintain normal blood lipids and cholesterol by exercising and minimizing your intake of saturated fat. Eat a balanced diet with plenty of fruits and vegetables. Blood tests for lipids and cholesterol should begin at age 20 and be repeated every 5 years. If your lipid or cholesterol levels are high, you are over 50, or you are a high risk for heart disease, you may need your cholesterol levels checked more frequently.Ongoing high lipid and cholesterol levels should be treated with medicines, if diet and exercise are not effective.  If you smoke, find out from your caregiver how to quit. If you do not use tobacco, do not start.  If you  choose to drink alcohol, do not exceed 2 drinks per day. One drink is considered to be 12 ounces (355 mL) of beer, 5 ounces (148 mL) of wine, or 1.5 ounces (44 mL) of liquor.  Avoid use of street drugs. Do not share needles with anyone. Ask for help if you need support or instructions about stopping the use of drugs.  High blood pressure causes heart disease and increases the risk of stroke. Blood pressure should be checked at least every 1 to 2 years. Ongoing high blood pressure should be treated with medicines if weight loss and exercise are not effective.  If you are 45 to 74 years old, ask your caregiver if you should take aspirin to prevent heart disease.  Diabetes screening involves taking a blood sample to check your fasting blood sugar level. This should be done once every 3 years, after age 45, if you are within normal weight and without risk factors for diabetes. Testing should be considered at a younger age or be carried out more frequently if you are overweight and have at least 1 risk factor for diabetes.  Colorectal cancer can be detected and often prevented. Most routine colorectal cancer screening begins at the age of 50 and continues through age 75. However, your caregiver may recommend screening at an earlier age if you have risk factors for colon cancer. On a yearly basis, your caregiver may provide home test kits to check for hidden blood in the stool. Use of a small camera at the end of a tube,   to directly examine the colon (sigmoidoscopy or colonoscopy), can detect the earliest forms of colorectal cancer. Talk to your caregiver about this at age 50, when routine screening begins. Direct examination of the colon should be repeated every 5 to 10 years through age 75, unless early forms of pre-cancerous polyps or small growths are found.  Hepatitis C blood testing is recommended for all people born from 1945 through 1965 and any individual with known risks for hepatitis C.  Healthy  men should no longer receive prostate-specific antigen (PSA) blood tests as part of routine cancer screening. Consult with your caregiver about prostate cancer screening.  Testicular cancer screening is not recommended for adolescents or adult males who have no symptoms. Screening includes self-exam, caregiver exam, and other screening tests. Consult with your caregiver about any symptoms you have or any concerns you have about testicular cancer.  Practice safe sex. Use condoms and avoid high-risk sexual practices to reduce the spread of sexually transmitted infections (STIs).  Use sunscreen with a sun protection factor (SPF) of 30 or greater. Apply sunscreen liberally and repeatedly throughout the day. You should seek shade when your shadow is shorter than you. Protect yourself by wearing long sleeves, pants, a wide-brimmed hat, and sunglasses year round, whenever you are outdoors.  Notify your caregiver of new moles or changes in moles, especially if there is a change in shape or color. Also notify your caregiver if a mole is larger than the size of a pencil eraser.  A one-time screening for abdominal aortic aneurysm (AAA) and surgical repair of large AAAs by sound wave imaging (ultrasonography) is recommended for ages 65 to 75 years who are current or former smokers.  Stay current with your immunizations. Document Released: 11/18/2007 Document Revised: 08/14/2011 Document Reviewed: 10/17/2010 [employer] Patient Information 2013 [employer].  

## 2012-08-01 ENCOUNTER — Encounter: Payer: Self-pay | Admitting: Internal Medicine

## 2012-08-01 LAB — VALPROIC ACID LEVEL: Valproic Acid Lvl: 47.2 ug/mL — ABNORMAL LOW (ref 50.0–100.0)

## 2012-08-03 NOTE — Assessment & Plan Note (Signed)
Labs today

## 2012-08-03 NOTE — Assessment & Plan Note (Signed)

## 2012-08-03 NOTE — Assessment & Plan Note (Signed)
I will check his PSA today He has no symptoms

## 2012-08-03 NOTE — Assessment & Plan Note (Signed)
He is doing well I will check his valproic acid level today and other labs to look for toxicity

## 2012-10-30 ENCOUNTER — Encounter: Payer: Self-pay | Admitting: Internal Medicine

## 2012-10-30 ENCOUNTER — Encounter (HOSPITAL_BASED_OUTPATIENT_CLINIC_OR_DEPARTMENT_OTHER): Payer: Medicare Other | Attending: General Surgery

## 2012-10-30 ENCOUNTER — Ambulatory Visit (INDEPENDENT_AMBULATORY_CARE_PROVIDER_SITE_OTHER): Payer: Medicare Other | Admitting: Internal Medicine

## 2012-10-30 VITALS — BP 140/80 | HR 80 | Temp 97.7°F | Resp 16 | Wt 208.0 lb

## 2012-10-30 DIAGNOSIS — T3110 Burns involving 10-19% of body surface with 0% to 9% third degree burns: Secondary | ICD-10-CM

## 2012-10-30 DIAGNOSIS — T3111 Burns involving 10-19% of body surface with 10-19% third degree burns: Secondary | ICD-10-CM

## 2012-10-30 DIAGNOSIS — W899XXA Exposure to unspecified man-made visible and ultraviolet light, initial encounter: Secondary | ICD-10-CM | POA: Insufficient documentation

## 2012-10-30 DIAGNOSIS — L551 Sunburn of second degree: Secondary | ICD-10-CM | POA: Insufficient documentation
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
## 2012-10-31 ENCOUNTER — Telehealth: Payer: Self-pay | Admitting: *Deleted
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
## 2012-11-04 ENCOUNTER — Telehealth: Payer: Self-pay | Admitting: Internal Medicine

## 2012-11-04 ENCOUNTER — Telehealth: Payer: Self-pay

## 2012-11-04 MED ORDER — HYDROCODONE-ACETAMINOPHEN 5-325 MG PO TABS: 1.0000 | ORAL_TABLET | Freq: Four times a day (QID) | ORAL | Status: DC | PRN

## 2012-11-04 NOTE — Telephone Encounter (Signed)
done

## 2012-11-04 NOTE — Telephone Encounter (Signed)
LMOVM to call back with pharmacy information
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
## 2012-11-05 NOTE — Telephone Encounter (Signed)
error 

## 2012-11-06 ENCOUNTER — Encounter (HOSPITAL_BASED_OUTPATIENT_CLINIC_OR_DEPARTMENT_OTHER): Payer: Medicare Other | Attending: General Surgery

## 2012-11-06 DIAGNOSIS — X19XXXA Contact with other heat and hot substances, initial encounter: Secondary | ICD-10-CM | POA: Insufficient documentation

## 2012-11-06 DIAGNOSIS — W899XXA Exposure to unspecified man-made visible and ultraviolet light, initial encounter: Secondary | ICD-10-CM | POA: Insufficient documentation

## 2012-11-06 DIAGNOSIS — L551 Sunburn of second degree: Secondary | ICD-10-CM | POA: Insufficient documentation

## 2012-11-28 ENCOUNTER — Encounter: Payer: Self-pay | Admitting: Internal Medicine

## 2012-11-28 ENCOUNTER — Ambulatory Visit (INDEPENDENT_AMBULATORY_CARE_PROVIDER_SITE_OTHER): Payer: Medicare Other | Admitting: Internal Medicine

## 2012-11-28 VITALS — BP 130/80 | HR 84 | Temp 98.0°F | Resp 16 | Wt 201.8 lb

## 2012-11-28 DIAGNOSIS — K8020 Calculus of gallbladder without cholecystitis without obstruction: Secondary | ICD-10-CM

## 2012-11-28 DIAGNOSIS — F319 Bipolar disorder, unspecified: Secondary | ICD-10-CM

## 2012-11-28 NOTE — Patient Instructions (Signed)
Cholelithiasis Cholelithiasis (also called gallstones) is a form of gallbladder disease where gallstones form in your gallbladder. The gallbladder is a non-essential organ that stores bile made in the liver, which helps digest fats. Gallstones begin as small crystals and slowly grow into stones. Gallstone pain occurs when the gallbladder spasms, and a gallstone is blocking the duct. Pain can also occur when a stone passes out of the duct.  Women are more likely to develop gallstones than men. Other factors that increase the risk of gallbladder disease are:  Having multiple pregnancies. Physicians sometimes advise removing diseased gallbladders before future pregnancies.  Obesity.  Diets heavy in fried foods and fat.  Increasing age (older than 60).  Prolonged use of medications containing male hormones.  Diabetes mellitus.  Rapid weight loss.  Family history of gallstones (heredity). SYMPTOMS  Feeling sick to your stomach (nauseous).  Abdominal pain.  Yellowing of the skin (jaundice).  Sudden pain. It may persist from several minutes to several hours.  Worsening pain with deep breathing or when jarred.  Fever.  Tenderness to the touch. In some cases, when gallstones do not move into the bile duct, people have no pain or symptoms. These are called "silent" gallstones. TREATMENT In severe cases, emergency surgery may be required. HOME CARE INSTRUCTIONS   Only take over-the-counter or prescription medicines for pain, discomfort, or fever as directed by your caregiver.  Follow a low-fat diet until seen again. Fat causes the gallbladder to contract, which can result in pain.  Follow up as instructed. Attacks are almost always recurrent and surgery is usually required for permanent treatment. SEEK IMMEDIATE MEDICAL CARE IF:   Your pain increases and is not controlled by medications.  You have an oral temperature above 102 F (38.9 C), not controlled by medication.  You  develop nausea and vomiting. MAKE SURE YOU:   Understand these instructions.  Will watch your condition.  Will get help right away if you are not doing well or get worse. Document Released: 05/18/2005 Document Revised: 08/14/2011 Document Reviewed: 07/21/2010 [employer] Patient Information 2014 [employer].  

## 2012-11-28 NOTE — Assessment & Plan Note (Signed)
I think his abd pain may be related to the gallstone so I have asked him to see General Surgery

## 2012-11-28 NOTE — Assessment & Plan Note (Signed)
He is stable on Luvox and Depakote

## 2012-11-28 NOTE — Progress Notes (Signed)
Subjective:    Patient ID: Christian Hurley, male    DOB: 10-31-1940, 72 y.o.   MRN: [id number]  Abdominal Pain This is a chronic problem. The current episode started more than 1 month ago. The onset quality is gradual. The problem occurs intermittently. The problem has been unchanged. The pain is located in the RUQ and epigastric region. The pain is at a severity of 1/10. The pain is mild. The quality of the pain is dull. The abdominal pain does not radiate. Associated symptoms include nausea. Pertinent negatives include no anorexia, arthralgias, belching, constipation, diarrhea, dysuria, fever, flatus, frequency, headaches, hematochezia, hematuria, melena, myalgias, vomiting or weight loss. The pain is aggravated by eating. The pain is relieved by nothing. He has tried nothing for the symptoms. His past medical history is significant for abdominal surgery and gallstones. There is no history of colon cancer, Crohn's disease, GERD, irritable bowel syndrome, pancreatitis, PUD or ulcerative colitis.      Review of Systems  Constitutional: Negative.  Negative for fever, chills, weight loss, diaphoresis, activity change, appetite change, fatigue and unexpected weight change.  HENT: Negative.   Eyes: Negative.   Respiratory: Negative.   Cardiovascular: Negative for chest pain, palpitations and leg swelling.  Gastrointestinal: Positive for nausea and abdominal pain. Negative for vomiting, diarrhea, constipation, blood in stool, melena, hematochezia, abdominal distention, anal bleeding, rectal pain, anorexia and flatus.  Endocrine: Negative.   Genitourinary: Negative.  Negative for dysuria, frequency and hematuria.  Musculoskeletal: Negative.  Negative for myalgias and arthralgias.  Skin: Negative.   Allergic/Immunologic: Negative.   Neurological: Negative.  Negative for headaches.  Hematological: Negative.   Psychiatric/Behavioral: Positive for dysphoric mood and decreased concentration. Negative  for suicidal ideas, hallucinations, behavioral problems, confusion, sleep disturbance, self-injury and agitation. The patient is not nervous/anxious and is not hyperactive.        Objective:   Physical Exam  Vitals reviewed. Constitutional: He is oriented to person, place, and time. He appears well-developed and well-nourished.  Non-toxic appearance. He does not have a sickly appearance. He does not appear ill. No distress.  HENT:  Head: Normocephalic and atraumatic.  Mouth/Throat: Oropharynx is clear and moist. No oropharyngeal exudate.  Eyes: Conjunctivae are normal. Right eye exhibits no discharge. Left eye exhibits no discharge. No scleral icterus.  Neck: Normal range of motion. Neck supple. No JVD present. No tracheal deviation present. No thyromegaly present.  Cardiovascular: Normal rate, regular rhythm, normal heart sounds and intact distal pulses.  Exam reveals no gallop and no friction rub.   No murmur heard. Pulmonary/Chest: Effort normal and breath sounds normal. No stridor. No respiratory distress. He has no wheezes. He has no rales. He exhibits no tenderness.  Abdominal: Soft. Normal appearance and bowel sounds are normal. He exhibits no distension, no pulsatile liver, no abdominal bruit, no pulsatile midline mass and no mass. There is no hepatosplenomegaly, splenomegaly or hepatomegaly. There is no tenderness. There is no rebound, no guarding and no CVA tenderness. A hernia is present. Hernia confirmed positive in the ventral area. Hernia confirmed negative in the right inguinal area and confirmed negative in the left inguinal area.  Musculoskeletal: Normal range of motion. He exhibits no edema and no tenderness.  Lymphadenopathy:    He has no cervical adenopathy.  Neurological: He is oriented to person, place, and time.  Skin: Skin is warm and dry. No rash noted. He is not diaphoretic. No erythema. No pallor.  Psychiatric: He has a normal mood and affect. His behavior is  normal.  Judgment and thought content normal.      Lab Results  Component Value Date   WBC 5.3 07/31/2012   HGB 13.5 07/31/2012   HCT 40.3 07/31/2012   PLT 235.0 07/31/2012   GLUCOSE 86 07/31/2012   CHOL 157 07/31/2012   TRIG 62.0 07/31/2012   HDL 45.20 07/31/2012   LDLCALC 99 07/31/2012   ALT 12 07/31/2012   AST 20 07/31/2012   NA 143 07/31/2012   K 4.9 07/31/2012   CL 107 07/31/2012   CREATININE 1.1 07/31/2012   BUN 14 07/31/2012   CO2 26 07/31/2012   TSH 1.88 07/31/2012   PSA 0.61 07/31/2012   HGBA1C 5.4 07/31/2011      Assessment & Plan:

## 2012-12-11 ENCOUNTER — Ambulatory Visit (INDEPENDENT_AMBULATORY_CARE_PROVIDER_SITE_OTHER): Payer: Medicare Other | Admitting: General Surgery

## 2012-12-30 ENCOUNTER — Ambulatory Visit (INDEPENDENT_AMBULATORY_CARE_PROVIDER_SITE_OTHER): Payer: Medicare Other | Admitting: General Surgery

## 2012-12-30 ENCOUNTER — Encounter (INDEPENDENT_AMBULATORY_CARE_PROVIDER_SITE_OTHER): Payer: Self-pay | Admitting: General Surgery

## 2012-12-30 VITALS — BP 122/72 | HR 72 | Temp 97.9°F | Resp 15 | Ht 69.0 in | Wt 201.8 lb

## 2012-12-30 DIAGNOSIS — K8020 Calculus of gallbladder without cholecystitis without obstruction: Secondary | ICD-10-CM

## 2012-12-30 NOTE — Progress Notes (Signed)
Subjective:     Patient ID: Christian Hurley, male   DOB: 05-28-1941, 72 y.o.   MRN: [id number]  HPI  This is a 72 year old male who about 2 years ago I repaired a incisional hernia in the subxiphoid position there was a multiple recurrent hip. I fixed this with mesh and a component separation. He's done fairly well with that since then. He was known to have a large gallstone prior to that. He for the past 2 or 3 years has had some epigastric pain. He now also complains of numbness around epigastrium since my surgery. He describes no nausea or vomiting. He is having a bowel movement every other day which is his standard. He is due to get left eye cataract surgery on September 3. His other complaint is cramps in both sides it is associated with exertion and rotation of his trunk. He is said to be evaluated to see if the gallstone is the source of his current symptoms. Review of Systems     Objective:   Physical Exam  Vitals reviewed. Constitutional: He appears well-developed and well-nourished.  Abdominal: Soft. Bowel sounds are normal. He exhibits no distension. There is tenderness (minimal tenderness at epigastrium) in the epigastric area. There is negative Murphy's sign. No hernia. Hernia confirmed negative in the ventral area.         Assessment:     Abdominal pain     Plan:     He does have a known gallstone. I'm not entirely sure that his symptoms are from that. Removing his gallbladder would not be without some difficulty. I think it is important to determine whether or not his symptoms are referable to his gallbladder. To that end I ordered a HIDA scan with a CCK injection. He would like to have his cataract fixed and then come see me after he gets that done. I think that many of his symptoms can be explained by his incisional hernia repair. If the HIDA scan does not really reproduces symptoms I think it might be reasonable to just monitor this for right now.

## 2012-12-31 ENCOUNTER — Telehealth (INDEPENDENT_AMBULATORY_CARE_PROVIDER_SITE_OTHER): Payer: Self-pay | Admitting: General Surgery

## 2012-12-31 NOTE — Telephone Encounter (Signed)
Spoke with patient's wife and made her aware of HIDA scan scheduled for 01/10/2013 to arrive at 8:45am. Nothing to eat or drink after midnight. They will be there for appt and call with any questions.

## 2012-12-31 NOTE — Addendum Note (Signed)
Addended byLiliana Cline on: 12/31/2012 08:44 AM   Modules accepted: Orders

## 2013-01-10 ENCOUNTER — Encounter (HOSPITAL_COMMUNITY)
Admission: RE | Admit: 2013-01-10 | Discharge: 2013-01-10 | Disposition: A | Discharge status: Home / Self Care | Payer: Medicare Other | Source: Ambulatory Visit | Attending: General Surgery | Admitting: General Surgery

## 2013-01-10 DIAGNOSIS — K8020 Calculus of gallbladder without cholecystitis without obstruction: Secondary | ICD-10-CM | POA: Insufficient documentation

## 2013-01-10 MED ORDER — MORPHINE SULFATE 4 MG/ML IJ SOLN
3.6500 mg | Freq: Once | INTRAMUSCULAR | Status: AC
  Administered 2013-01-10: 11:00:00 via INTRAVENOUS

## 2013-01-10 MED ORDER — TECHNETIUM TC 99M MEBROFENIN IV KIT
5.0000 | PACK | Freq: Once | INTRAVENOUS | Status: AC | PRN
  Administered 2013-01-10: 5 via INTRAVENOUS

## 2013-01-11 IMAGING — CT CT ABDOMEN W/O CM
2 of 4 series · 17 of 46 positions shown, 19 images · non-contrast
Comparison: Abdomen films of 08/17/2010

CLINICAL DATA: History of prior umbilical and inguinal hernia
repairs, evaluate for ventral hernia

CT ABDOMEN WITHOUT CONTRAST
TECHNIQUE: Multidetector CT imaging of the abdomen was performed
following the standard protocol without IV contrast.

[Series 100: abd without · axial · non-contrast · 0.82mm/px · z∈[-195,+35]mm · 14 of 52 slices shown, 16 images]
[im 3/52  soft-tissue]
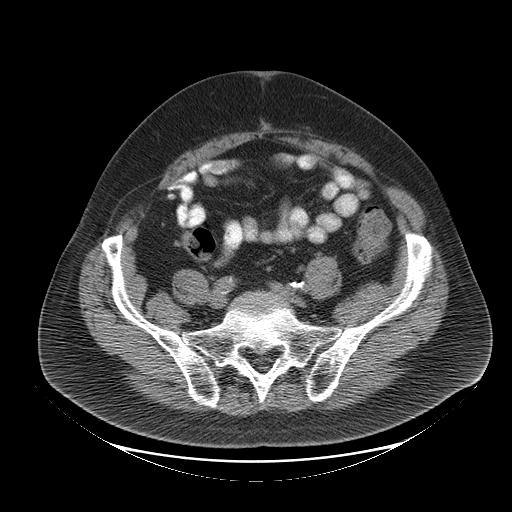
[im 3/52  bone]
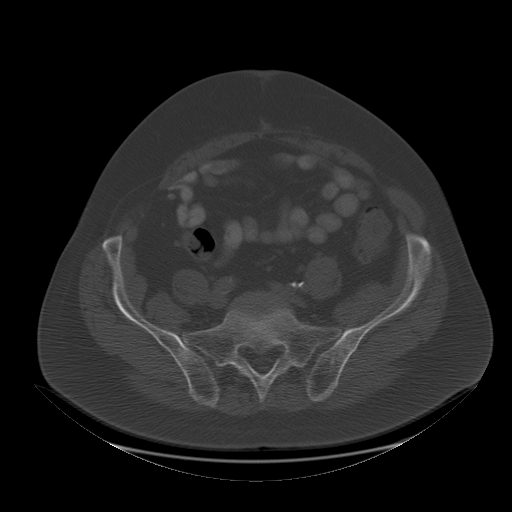
[im 7/52  soft-tissue]
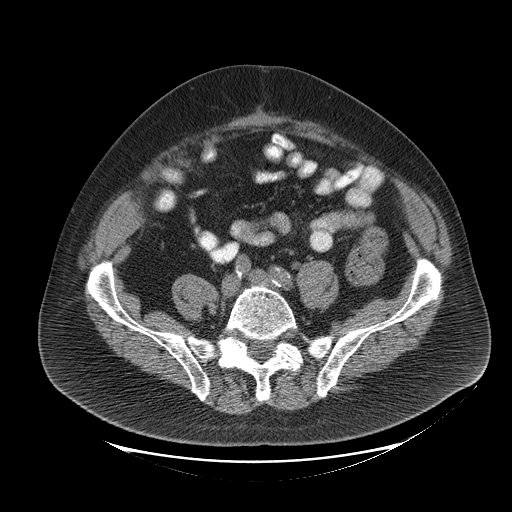
[im 9/52  soft-tissue]
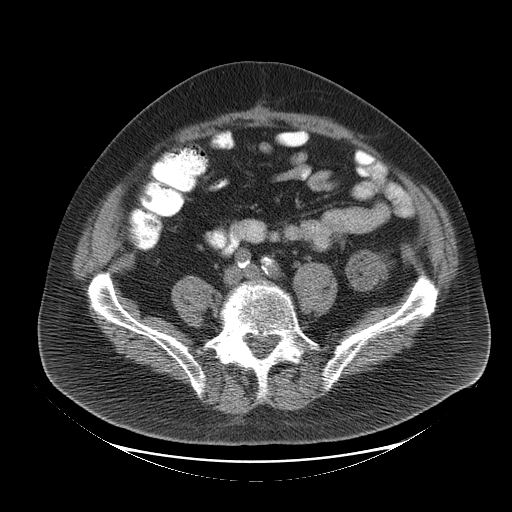
[im 14/52  soft-tissue]
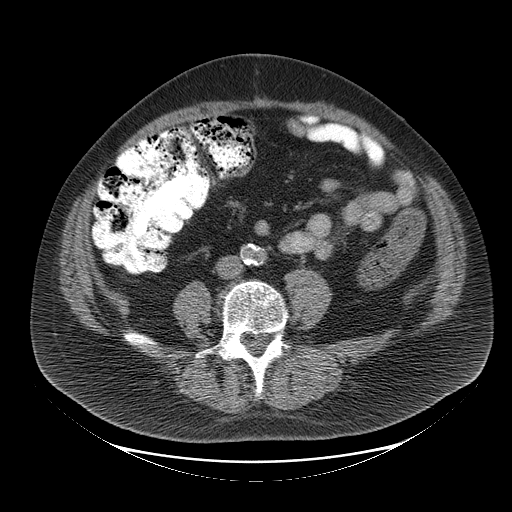
[im 18/52  soft-tissue]
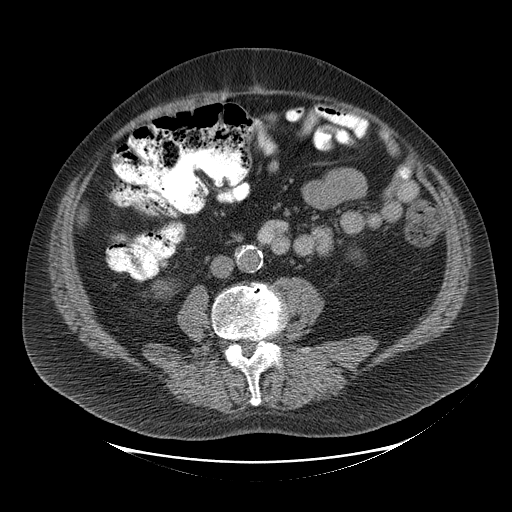
[im 20/52  soft-tissue]
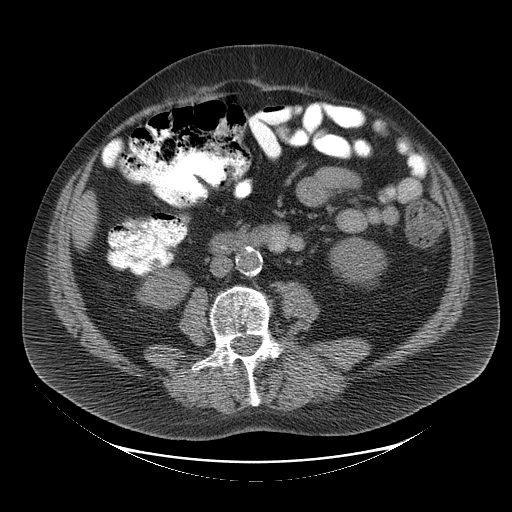
[im 25/52  soft-tissue]
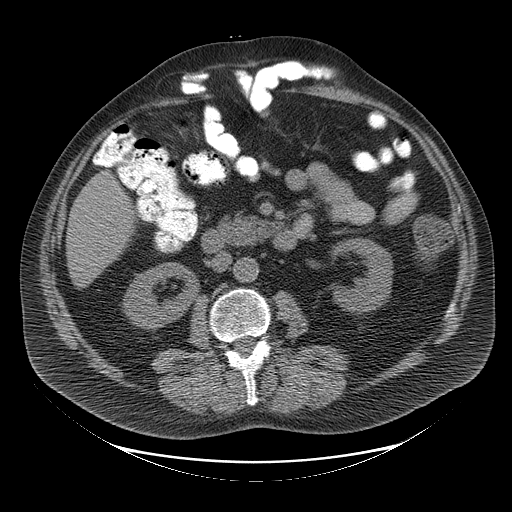
[im 27/52  soft-tissue]
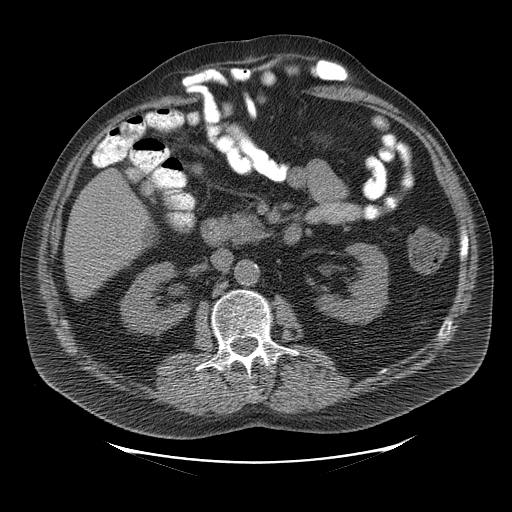
[im 32/52  soft-tissue]
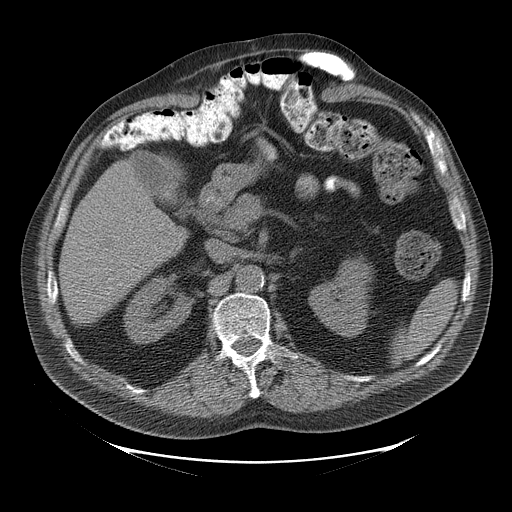
[im 32/52  bone]
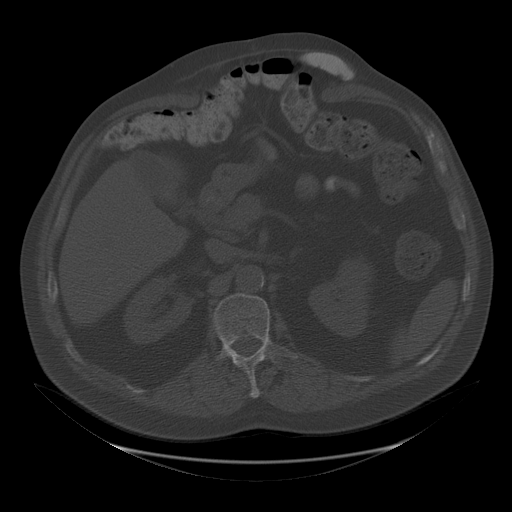
[im 34/52  soft-tissue]
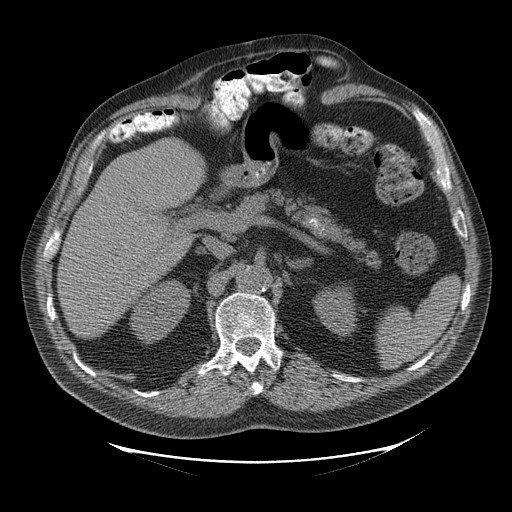
[im 38/52  soft-tissue]
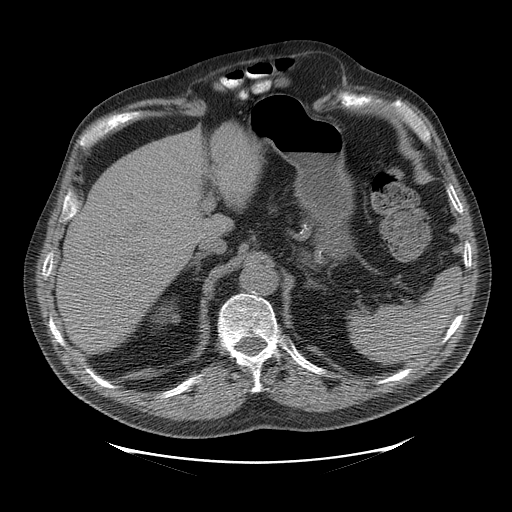
[im 43/52  soft-tissue]
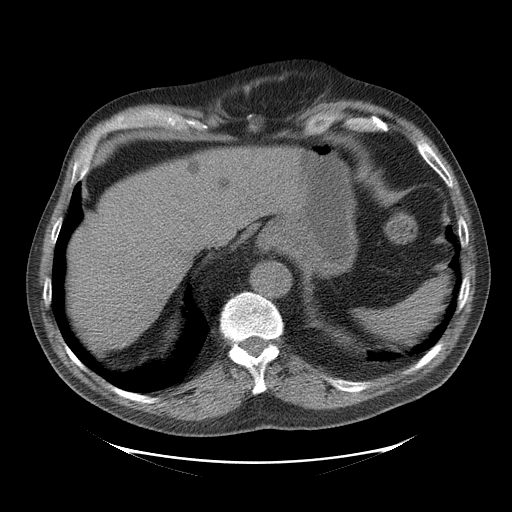
[im 45/52  soft-tissue]
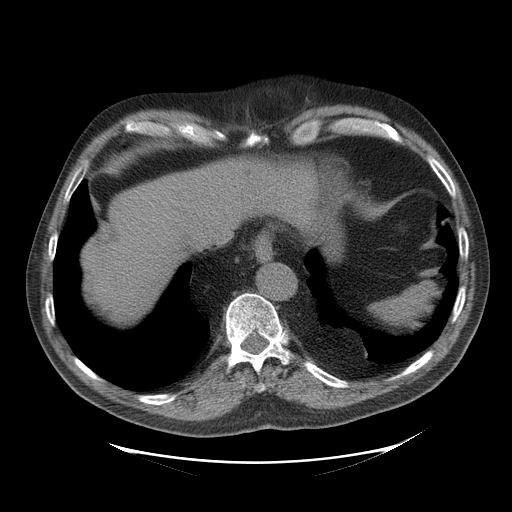
[im 49/52  soft-tissue]
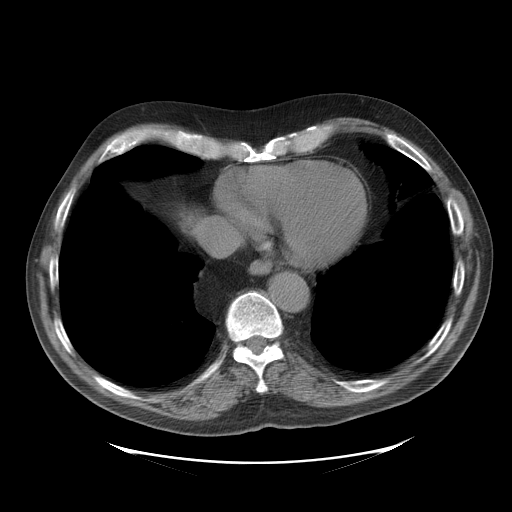

[Series 103: cor · coronal · 0.82mm/px · 3 of 140 slices shown]
[im 47/140  soft-tissue]
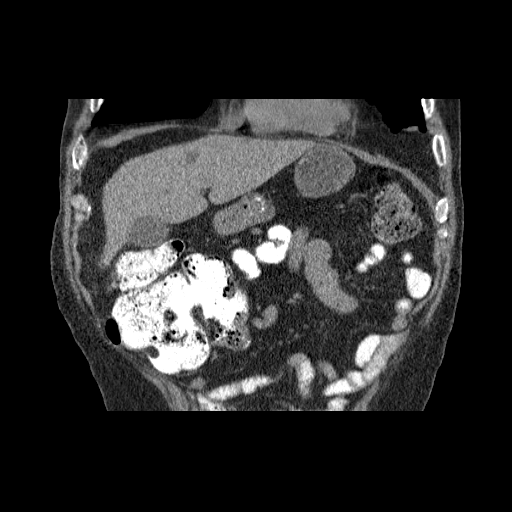
[im 62/140  soft-tissue]
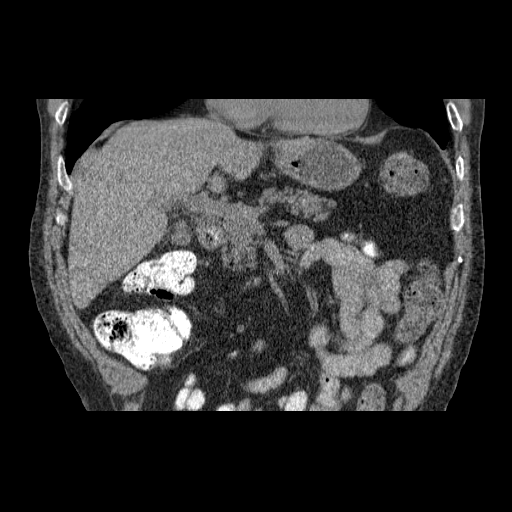
[im 78/140  soft-tissue]
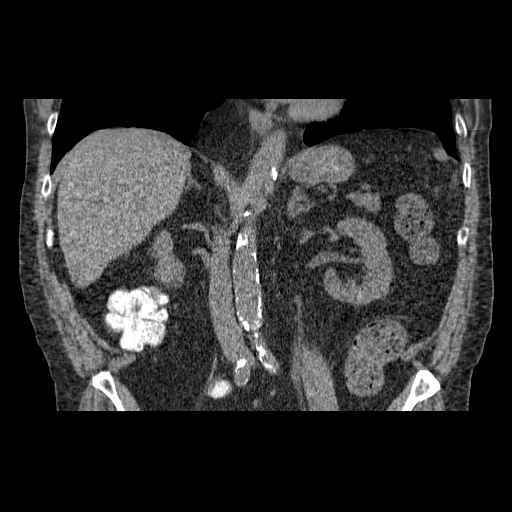

[17 of 46 positions shown; findings below may reference images not displayed]

FINDINGS: The lung bases are clear.  There is considerable
mediastinal fat in the lower hemithoraces bilaterally.  Several low
attenuation liver lesions are present most consistent with cysts.
There appears to be a large gallstone within the gallbladder of
approximately 22 mm in diameter.  No gallbladder wall thickening is
seen.  The pancreas is normal in size and the pancreatic duct is
not dilated.  The adrenal glands and spleen are unremarkable.  The
stomach is not well distended but is unremarkable.  No definite
renal calculi are seen and there is no evidence of hydronephrosis.
The proximal ureters are normal in caliber.  The abdominal aorta is
normal in caliber with atheromatous change diffusely.

There is a midline ventral  hernia present containing nondilated
loops of small bowel and mid transverse colon.  The appendix is
well visualized and is normal, as is the terminal ileum.
Degenerative disc disease is noted in the lumbar spine.
IMPRESSION: 1.  Midline ventral hernia containing nondilated loops of small
bowel and the mid transverse colon.
2.  Large gallstone.  No gallbladder wall thickening.

## 2013-03-03 ENCOUNTER — Encounter (INDEPENDENT_AMBULATORY_CARE_PROVIDER_SITE_OTHER): Payer: Self-pay | Admitting: General Surgery

## 2013-03-03 ENCOUNTER — Ambulatory Visit (INDEPENDENT_AMBULATORY_CARE_PROVIDER_SITE_OTHER): Payer: Medicare Other | Admitting: General Surgery

## 2013-03-03 VITALS — BP 118/70 | HR 85 | Temp 97.5°F | Ht 69.5 in | Wt 201.6 lb

## 2013-03-03 DIAGNOSIS — K8020 Calculus of gallbladder without cholecystitis without obstruction: Secondary | ICD-10-CM

## 2013-03-03 NOTE — Progress Notes (Signed)
Subjective:     Patient ID: Christian Hurley, male   DOB: 21-Sep-1940, 72 y.o.   MRN: [id number]  HPI This is a 72 year old male who about 2 years ago I repaired a incisional hernia in the subxiphoid position. I fixed this with mesh and a component separation. He's done fairly well with that since then. He was known to have a large gallstone prior to that. He for the past 2 or 3 years has had some epigastric pain. He now also complains of numbness around epigastrium since my surgery. He describes no nausea or vomiting. He is having no abdominal symptoms at all right now.  He underwent left cataract surgery recently and has done well from that.  He comes in today to discuss options after hida scan.   Review of Systems NUCLEAR MEDICINE HEPATOBILIARY WITH GB, PHARM AND QUAN MEASURE  Radiopharmaceutical: 5 mCi technetium Choletec. An additional at  0.5 mCi of technetium Choletec was given in 90 minutes due to the  nonvisualization of the gallbladder.  The patient was given 3.65 mg of morphine IV at the time of the  second dose.  Comparison: CT scan dated 09/07/2010  Findings: Activity is seen in the duodenum at 20 minutes. The  gallbladder never visualized even after morphine.  IMPRESSION:  Findings are consistent with obstruction of the cystic duct. This  could indicate chronic or acute cholecystitis.     Objective:   Physical Exam  Vitals reviewed. Eyes: No scleral icterus.  Abdominal: Soft. There is no tenderness. There is negative Murphy's sign. No hernia. Hernia confirmed negative in the ventral area.         Assessment:     History gallstones     Plan:     He has an abnormal HIDA scan which I think could be consistent with chronic cholecystitis. He also has gallstones on a prior ultrasound. I was not sure that his symptoms were related to his gallbladder when I last saw him and now he has absolutely no symptoms at all. There is small chance he could have urgent/emergent issues  with his gb but I think operative risk given prior surgery is also there.  We had long discussion about how to proceed given lack of symptoms right now.  He clearly does not have acute cholecystitis.  We decided to monitor for now but he will call if he has any issues and understands it could be emergent.  I will see back in 6 months.

## 2013-03-06 ENCOUNTER — Ambulatory Visit (INDEPENDENT_AMBULATORY_CARE_PROVIDER_SITE_OTHER): Payer: Medicare Other

## 2013-03-06 DIAGNOSIS — Z23 Encounter for immunization: Secondary | ICD-10-CM

## 2013-04-21 ENCOUNTER — Ambulatory Visit: Payer: Medicare Other | Admitting: Internal Medicine

## 2013-04-23 ENCOUNTER — Encounter: Payer: Self-pay | Admitting: Internal Medicine

## 2013-04-23 ENCOUNTER — Ambulatory Visit (INDEPENDENT_AMBULATORY_CARE_PROVIDER_SITE_OTHER)
Admission: RE | Admit: 2013-04-23 | Discharge: 2013-04-23 | Disposition: A | Discharge status: Home / Self Care | Payer: Medicare Other | Source: Ambulatory Visit | Attending: Internal Medicine | Admitting: Internal Medicine

## 2013-04-23 ENCOUNTER — Ambulatory Visit (INDEPENDENT_AMBULATORY_CARE_PROVIDER_SITE_OTHER): Payer: Medicare Other | Admitting: Internal Medicine

## 2013-04-23 VITALS — BP 140/72 | HR 72 | Temp 97.3°F | Resp 16 | Wt 204.0 lb

## 2013-04-23 DIAGNOSIS — R05 Cough: Secondary | ICD-10-CM

## 2013-04-23 DIAGNOSIS — R059 Cough, unspecified: Secondary | ICD-10-CM

## 2013-04-23 DIAGNOSIS — J441 Chronic obstructive pulmonary disease with (acute) exacerbation: Secondary | ICD-10-CM

## 2013-04-23 DIAGNOSIS — R911 Solitary pulmonary nodule: Secondary | ICD-10-CM | POA: Insufficient documentation

## 2013-04-23 DIAGNOSIS — J449 Chronic obstructive pulmonary disease, unspecified: Secondary | ICD-10-CM

## 2013-04-23 MED ORDER — ACLIDINIUM BROMIDE 400 MCG/ACT IN AEPB: 1.0000 | INHALATION_SPRAY | Freq: Two times a day (BID) | RESPIRATORY_TRACT | Status: DC

## 2013-04-23 MED ORDER — METHYLPREDNISOLONE ACETATE 80 MG/ML IJ SUSP: 80.0000 mg | Freq: Once | INTRAMUSCULAR | Status: DC

## 2013-04-23 MED ORDER — HYDROCODONE-HOMATROPINE 5-1.5 MG/5ML PO SYRP: 5.0000 mL | ORAL_SOLUTION | Freq: Three times a day (TID) | ORAL | Status: DC | PRN

## 2013-04-23 MED ORDER — METHYLPREDNISOLONE ACETATE 80 MG/ML IJ SUSP
120.0000 mg | Freq: Once | INTRAMUSCULAR | Status: AC
  Administered 2013-04-23: 120 mg via INTRAMUSCULAR

## 2013-04-23 MED ORDER — CEFUROXIME AXETIL 500 MG PO TABS: 500.0000 mg | ORAL_TABLET | Freq: Two times a day (BID) | ORAL | Status: DC

## 2013-04-23 NOTE — Progress Notes (Signed)
Subjective:    Patient ID: Christian Hurley, male    DOB: Oct 22, 1940, 72 y.o.   MRN: [id number]  Cough This is a new problem. The current episode started 1 to 4 weeks ago. The problem has been gradually worsening. The problem occurs every few hours. The cough is productive of purulent sputum. Associated symptoms include wheezing. Pertinent negatives include no chest pain, chills, ear congestion, ear pain, fever, headaches, heartburn, hemoptysis, myalgias, nasal congestion, postnasal drip, rash, rhinorrhea, sore throat, shortness of breath, sweats or weight loss. He has tried nothing for the symptoms. His past medical history is significant for COPD. There is no history of asthma, bronchiectasis, bronchitis, emphysema, environmental allergies or pneumonia.      Review of Systems  Constitutional: Negative for fever, chills, weight loss, diaphoresis, appetite change and fatigue.  HENT: Negative.  Negative for ear pain, postnasal drip, rhinorrhea, sore throat, tinnitus, trouble swallowing and voice change.   Eyes: Negative.   Respiratory: Positive for cough and wheezing. Negative for apnea, hemoptysis, choking, chest tightness, shortness of breath and stridor.   Cardiovascular: Negative.  Negative for chest pain, palpitations and leg swelling.  Gastrointestinal: Negative.  Negative for heartburn, nausea, vomiting, abdominal pain, diarrhea, constipation and blood in stool.  Endocrine: Negative.   Genitourinary: Negative.   Musculoskeletal: Negative.  Negative for arthralgias, back pain, gait problem, joint swelling, myalgias, neck pain and neck stiffness.  Skin: Negative.  Negative for color change, pallor, rash and wound.  Allergic/Immunologic: Negative.  Negative for environmental allergies.  Neurological: Negative.  Negative for dizziness, weakness and headaches.  Hematological: Negative.  Negative for adenopathy. Does not bruise/bleed easily.  Psychiatric/Behavioral: Negative.         Objective:   Physical Exam  Vitals reviewed. Constitutional: He is oriented to person, place, and time. He appears well-developed and well-nourished.  Non-toxic appearance. He does not have a sickly appearance. He does not appear ill. No distress.  HENT:  Head: Normocephalic and atraumatic.  Mouth/Throat: Oropharynx is clear and moist. No oropharyngeal exudate.  Eyes: Conjunctivae are normal. Right eye exhibits no discharge. Left eye exhibits no discharge. No scleral icterus.  Neck: Normal range of motion. Neck supple. No JVD present. No tracheal deviation present. No thyromegaly present.  Cardiovascular: Normal rate, regular rhythm, normal heart sounds and intact distal pulses.  Exam reveals no gallop and no friction rub.   No murmur heard. Pulmonary/Chest: Effort normal. No accessory muscle usage or stridor. Not tachypneic. No respiratory distress. He has no decreased breath sounds. He has no wheezes. He has rhonchi in the right middle field and the left middle field. He has no rales.  Abdominal: Soft. Bowel sounds are normal. He exhibits no distension and no mass. There is no tenderness. There is no rebound and no guarding.  Musculoskeletal: Normal range of motion. He exhibits no edema and no tenderness.  Lymphadenopathy:    He has no cervical adenopathy.  Neurological: He is oriented to person, place, and time.  Skin: Skin is warm and dry. No rash noted. He is not diaphoretic. No erythema. No pallor.  Psychiatric: He has a normal mood and affect. His behavior is normal. Judgment and thought content normal.     Lab Results  Component Value Date   WBC 5.3 07/31/2012   HGB 13.5 07/31/2012   HCT 40.3 07/31/2012   PLT 235.0 07/31/2012   GLUCOSE 86 07/31/2012   CHOL 157 07/31/2012   TRIG 62.0 07/31/2012   HDL 45.20 07/31/2012   LDLCALC 99  07/31/2012   ALT 12 07/31/2012   AST 20 07/31/2012   NA 143 07/31/2012   K 4.9 07/31/2012   CL 107 07/31/2012   CREATININE 1.1 07/31/2012   BUN 14 07/31/2012    CO2 26 07/31/2012   TSH 1.88 07/31/2012   PSA 0.61 07/31/2012   HGBA1C 5.4 07/31/2011       Assessment & Plan:

## 2013-04-23 NOTE — Progress Notes (Signed)
Pre visit review using our clinic review tool, if applicable. No additional management support is needed unless otherwise documented below in the visit note. 

## 2013-04-23 NOTE — Assessment & Plan Note (Signed)
I have ordered a CT scan to see if there is concern for malignancy

## 2013-04-23 NOTE — Assessment & Plan Note (Signed)
He is having an exacerbation so I gave him an injection of steroids and asked him to start ceftin for any infection He will try hycodan for the cough Also, I have asked him to start tudorza - I gave him a sample and showed him how to use it and he demonstrated proficiency with its use

## 2013-04-23 NOTE — Patient Instructions (Signed)

## 2013-04-23 NOTE — Assessment & Plan Note (Signed)
Start [place]

## 2013-04-23 NOTE — Assessment & Plan Note (Signed)
CXR is positive for a nodule but negative for pna

## 2013-04-24 ENCOUNTER — Telehealth: Payer: Self-pay | Admitting: Internal Medicine

## 2013-04-24 ENCOUNTER — Other Ambulatory Visit (INDEPENDENT_AMBULATORY_CARE_PROVIDER_SITE_OTHER): Payer: Medicare Other

## 2013-04-24 ENCOUNTER — Other Ambulatory Visit: Payer: Medicare Other

## 2013-04-24 DIAGNOSIS — R911 Solitary pulmonary nodule: Secondary | ICD-10-CM

## 2013-04-24 LAB — BUN: BUN: 14 mg/dL (ref 6–23)

## 2013-04-24 NOTE — Telephone Encounter (Signed)
Received message stating pt is scheduled for CT on 04/25/13 and will need to have labs drawn prior. Pt to stop by lab today and orders entered in EPIC.

## 2013-04-24 NOTE — Telephone Encounter (Signed)
Pt have an appt  For his CT CHEST W CONTRAST on 04-25-2013 @9 :30 per [address],  pt need an order  For  bun and creatine , need an order for him to have this done

## 2013-04-25 ENCOUNTER — Ambulatory Visit (INDEPENDENT_AMBULATORY_CARE_PROVIDER_SITE_OTHER)
Admission: RE | Admit: 2013-04-25 | Discharge: 2013-04-25 | Disposition: A | Discharge status: Home / Self Care | Payer: Medicare Other | Source: Ambulatory Visit | Attending: Internal Medicine | Admitting: Internal Medicine

## 2013-04-25 DIAGNOSIS — R911 Solitary pulmonary nodule: Secondary | ICD-10-CM

## 2013-04-25 MED ORDER — IOHEXOL 300 MG/ML  SOLN
100.0000 mL | Freq: Once | INTRAMUSCULAR | Status: AC | PRN
  Administered 2013-04-25: 100 mL via INTRAVENOUS

## 2013-04-27 ENCOUNTER — Encounter: Payer: Self-pay | Admitting: Internal Medicine

## 2013-08-13 ENCOUNTER — Ambulatory Visit (INDEPENDENT_AMBULATORY_CARE_PROVIDER_SITE_OTHER): Payer: Managed Care, Other (non HMO) | Admitting: Internal Medicine

## 2013-08-13 ENCOUNTER — Encounter: Payer: Self-pay | Admitting: Internal Medicine

## 2013-08-13 VITALS — BP 138/64 | HR 88 | Temp 98.4°F | Resp 16 | Ht 69.0 in | Wt 199.0 lb

## 2013-08-13 DIAGNOSIS — J209 Acute bronchitis, unspecified: Secondary | ICD-10-CM

## 2013-08-13 DIAGNOSIS — J449 Chronic obstructive pulmonary disease, unspecified: Secondary | ICD-10-CM

## 2013-08-13 DIAGNOSIS — J441 Chronic obstructive pulmonary disease with (acute) exacerbation: Secondary | ICD-10-CM

## 2013-08-13 MED ORDER — HYDROCODONE-HOMATROPINE 5-1.5 MG/5ML PO SYRP: 5.0000 mL | ORAL_SOLUTION | Freq: Three times a day (TID) | ORAL | Status: DC | PRN

## 2013-08-13 MED ORDER — CEFUROXIME AXETIL 500 MG PO TABS: 500.0000 mg | ORAL_TABLET | Freq: Two times a day (BID) | ORAL | Status: DC

## 2013-08-13 NOTE — Patient Instructions (Signed)
Chronic Obstructive Pulmonary Disease Exacerbation °Chronic obstructive pulmonary disease (COPD) is a common lung condition in which airflow from the lungs is limited. COPD is a general term that can be used to describe many different lung problems that limit airflow, including chronic bronchitis and emphysema. COPD exacerbations are episodes when breathing symptoms become much worse and require extra treatment. Without treatment, COPD exacerbations can be life threatening, and frequent COPD exacerbations can cause further damage to your lungs. °CAUSES  °· Respiratory infections.   °· Exposure to smoke.   °· Exposure to air pollution, chemical fumes, or dust. °Sometimes there is no apparent cause or trigger. °RISK FACTORS °· Smoking cigarettes. °· Older age. °· Frequent prior COPD exacerbations. °SIGNS AND SYMPTOMS  °· Increased coughing.   °· Increased thick spit (sputum) production.   °· Increased wheezing.   °· Increased shortness of breath.   °· Rapid breathing.   °· Chest tightness. °DIAGNOSIS  °Your medical history, a physical exam, and tests will help your health care provider make a diagnosis. Tests may include: °· A chest X-ray. °· Basic lab tests. °· Sputum testing. °· An arterial blood gas test. °TREATMENT  °Depending on the severity of your COPD exacerbation, you may need to be admitted to a hospital for treatment. Some of the treatments commonly used to treat COPD exacerbations are:  °· Antibiotic medicines.   °· Bronchodilators. These are drugs that expand the air passages. They may be given with an inhaler or nebulizer. Spacer devices may be needed to help improve drug delivery. °· Corticosteroid medicines. °· Supplemental oxygen therapy.   °HOME CARE INSTRUCTIONS  °· Do not smoke. Quitting smoking is very important to prevent COPD from getting worse and exacerbations from happening as often. °· Avoid exposure to all substances that irritate the airway, especially to tobacco smoke.   °· If prescribed,  take your antibiotics as directed. Finish them even if you start to feel better. °· Only take over-the-counter or prescription medicines as directed by your health care provider. It is important to use correct technique with inhaled medicines. °· Drink enough fluids to keep your urine clear or pale yellow (unless you have a medical condition that requires fluid restriction). °· Use a cool mist vaporizer. This makes it easier to clear your chest when you cough.   °· If you have a home nebulizer and oxygen, continue to use them as directed.   °· Maintain all necessary vaccinations to prevent infections.   °· Exercise regularly.   °· Eat a healthy diet.   °· Keep all follow-up appointments as directed by your health care provider. °SEEK IMMEDIATE MEDICAL CARE IF: °· You have worsening shortness of breath.   °· You have trouble talking.   °· You have severe chest pain. °· You have blood in your sputum.  °· You have a fever. °· You have weakness, vomit repeatedly, or faint.   °· You feel confused.   °· You continue to get worse. °MAKE SURE YOU:  °· Understand these instructions. °· Will watch your condition. °· Will get help right away if you are not doing well or get worse. °Document Released: 03/19/2007 Document Revised: 03/12/2013 Document Reviewed: 01/24/2013 °[employer]® Patient Information ©2014 [employer]. ° °

## 2013-08-13 NOTE — Progress Notes (Signed)
Pre visit review using our clinic review tool, if applicable. No additional management support is needed unless otherwise documented below in the visit note. 

## 2013-08-13 NOTE — Progress Notes (Signed)
Subjective:    Patient ID: Christian Hurley, male    DOB: 1941-02-02, 73 y.o.   MRN: [id number]  Cough This is a new problem. The current episode started in the past 7 days. The problem has been gradually worsening. The problem occurs every few hours. The cough is productive of purulent sputum. Associated symptoms include chills and wheezing. Pertinent negatives include no chest pain, ear congestion, ear pain, fever, headaches, heartburn, hemoptysis, myalgias, nasal congestion, postnasal drip, rhinorrhea, sore throat, shortness of breath, sweats or weight loss. Nothing aggravates the symptoms. He has tried ipratropium inhaler for the symptoms. The treatment provided moderate relief. His past medical history is significant for COPD. There is no history of asthma, bronchiectasis, bronchitis, emphysema, environmental allergies or pneumonia.      Review of Systems  Constitutional: Positive for chills. Negative for fever, weight loss, diaphoresis and fatigue.  HENT: Negative.  Negative for congestion, ear pain, postnasal drip, rhinorrhea, sinus pressure, sore throat and trouble swallowing.   Eyes: Negative.   Respiratory: Positive for cough and wheezing. Negative for hemoptysis and shortness of breath.   Cardiovascular: Negative.  Negative for chest pain, palpitations and leg swelling.  Gastrointestinal: Negative.  Negative for heartburn, nausea, vomiting, abdominal pain, diarrhea, constipation and blood in stool.  Endocrine: Negative.   Genitourinary: Negative.   Musculoskeletal: Negative.  Negative for arthralgias and myalgias.  Skin: Negative.   Allergic/Immunologic: Negative.  Negative for environmental allergies.  Neurological: Negative.  Negative for headaches.  Hematological: Negative.  Negative for adenopathy. Does not bruise/bleed easily.  Psychiatric/Behavioral: Negative.        Objective:   Physical Exam  Vitals reviewed. Constitutional: He is oriented to person, place, and  time. He appears well-developed and well-nourished.  Non-toxic appearance. He does not have a sickly appearance. He does not appear ill. No distress.  HENT:  Head: Normocephalic and atraumatic.  Mouth/Throat: Oropharynx is clear and moist.  Eyes: Conjunctivae are normal. Right eye exhibits no discharge. Left eye exhibits no discharge. No scleral icterus.  Neck: Normal range of motion. Neck supple. No JVD present. No tracheal deviation present. No thyromegaly present.  Cardiovascular: Normal rate, regular rhythm, normal heart sounds and intact distal pulses.  Exam reveals no gallop and no friction rub.   No murmur heard. Pulmonary/Chest: Effort normal and breath sounds normal. No accessory muscle usage or stridor. Not tachypneic. No respiratory distress. He has no decreased breath sounds. He has no wheezes. He has no rhonchi. He has no rales. He exhibits no tenderness.  Abdominal: Soft. Bowel sounds are normal. He exhibits no distension and no mass. There is no tenderness. There is no rebound and no guarding.  Musculoskeletal: Normal range of motion. He exhibits no edema and no tenderness.  Lymphadenopathy:    He has no cervical adenopathy.  Neurological: He is oriented to person, place, and time.  Skin: Skin is warm and dry. No rash noted. He is not diaphoretic. No erythema. No pallor.  Psychiatric: He has a normal mood and affect. His behavior is normal. Judgment and thought content normal.     Lab Results  Component Value Date   WBC 5.3 07/31/2012   HGB 13.5 07/31/2012   HCT 40.3 07/31/2012   PLT 235.0 07/31/2012   GLUCOSE 86 07/31/2012   CHOL 157 07/31/2012   TRIG 62.0 07/31/2012   HDL 45.20 07/31/2012   LDLCALC 99 07/31/2012   ALT 12 07/31/2012   AST 20 07/31/2012   NA 143 07/31/2012   K  4.9 07/31/2012   CL 107 07/31/2012   CREATININE 0.9 04/24/2013   BUN 14 04/24/2013   CO2 26 07/31/2012   TSH 1.88 07/31/2012   PSA 0.61 07/31/2012   HGBA1C 5.4 07/31/2011       Assessment & Plan:

## 2013-08-14 ENCOUNTER — Telehealth: Payer: Self-pay | Admitting: Internal Medicine

## 2013-08-14 NOTE — Telephone Encounter (Signed)
Relevant patient education assigned to patient using Emmi. ° °

## 2013-08-14 NOTE — Assessment & Plan Note (Addendum)
I will treat the infection with ceftin and will control the cough with hycodan 

## 2013-08-14 NOTE — Assessment & Plan Note (Signed)
This is well controlled [place]

## 2013-10-24 ENCOUNTER — Ambulatory Visit (INDEPENDENT_AMBULATORY_CARE_PROVIDER_SITE_OTHER): Payer: Medicare Other | Admitting: General Surgery

## 2013-11-14 ENCOUNTER — Ambulatory Visit (INDEPENDENT_AMBULATORY_CARE_PROVIDER_SITE_OTHER): Payer: Medicare HMO | Admitting: General Surgery

## 2014-03-20 ENCOUNTER — Ambulatory Visit (INDEPENDENT_AMBULATORY_CARE_PROVIDER_SITE_OTHER): Payer: Managed Care, Other (non HMO)

## 2014-03-20 DIAGNOSIS — Z23 Encounter for immunization: Secondary | ICD-10-CM

## 2014-04-29 ENCOUNTER — Institutional Professional Consult (permissible substitution): Payer: Managed Care, Other (non HMO) | Admitting: Internal Medicine

## 2014-06-19 ENCOUNTER — Encounter: Payer: Self-pay | Admitting: Internal Medicine

## 2014-06-19 ENCOUNTER — Ambulatory Visit (INDEPENDENT_AMBULATORY_CARE_PROVIDER_SITE_OTHER)
Admission: RE | Admit: 2014-06-19 | Discharge: 2014-06-19 | Disposition: A | Discharge status: Home / Self Care | Payer: Commercial Managed Care - HMO | Source: Ambulatory Visit | Attending: Internal Medicine | Admitting: Internal Medicine

## 2014-06-19 ENCOUNTER — Ambulatory Visit (INDEPENDENT_AMBULATORY_CARE_PROVIDER_SITE_OTHER): Payer: Commercial Managed Care - HMO | Admitting: Internal Medicine

## 2014-06-19 VITALS — BP 116/70 | HR 68 | Ht 69.0 in | Wt 207.0 lb

## 2014-06-19 DIAGNOSIS — K8020 Calculus of gallbladder without cholecystitis without obstruction: Secondary | ICD-10-CM | POA: Diagnosis not present

## 2014-06-19 DIAGNOSIS — J449 Chronic obstructive pulmonary disease, unspecified: Secondary | ICD-10-CM

## 2014-06-19 DIAGNOSIS — I700 Atherosclerosis of aorta: Secondary | ICD-10-CM | POA: Diagnosis not present

## 2014-06-19 DIAGNOSIS — Z0000 Encounter for general adult medical examination without abnormal findings: Secondary | ICD-10-CM | POA: Diagnosis not present

## 2014-06-19 NOTE — Assessment & Plan Note (Signed)
Gradual progression of disease, emphysema predominant with past history of inadequate response to bronchodilators. We will reassess before considering medication trial. Potential future candidate for pulmonary rehabilitation. Plan-schedule PFT and 6 minute walk test, chest x-ray

## 2014-06-19 NOTE — Patient Instructions (Addendum)
Order - CXR   Dx  COPD mixed type  Order- Schedule PFT and 6 MWT   Please call as needed

## 2014-06-19 NOTE — Assessment & Plan Note (Signed)
His wife mentions that he has gallstones, currently asymptomatic but noted as potential future problem

## 2014-06-19 NOTE — Progress Notes (Signed)
06/19/14- 73 yoM former smoker Pt here as self referral for chest congestion. Pt c/o non prod cough, DOE. Pt denies CP/tightness.  Wife is our patient. Patient was last seen in 2007 when he stopped smoking using Chantix. He now requests reevaluation of pulmonary status. Quit smoking 2007. 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 He denies history of heart disease or asthma but had pneumonia as an infant. Slowly worsening dyspnea on exertion with hills and stairs. No abrupt or acute events. Notices chest congestion lying down, dry cough. Previously tried inhaler without benefit. History of blood clots around 1993-specific information not available. 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 CT chest 04/25/13 IMPRESSION: COPD changes with right apical scarring. Stable subsegmental atelectasis versus scarring in lingula. No new intrathoracic abnormalities. Probable cholelithiasis. Stable left adrenal nodule. Electronically Signed  By: Ulyses Southward M.D.  On: 04/25/2013 10:11  Prior to Admission medications   Medication Sig Start Date End Date Taking? Authorizing Provider  divalproex (DEPAKOTE ER) 500 MG 24 hr tablet Take 1 tablet (500 mg total) by mouth daily. 07/01/12  Yes Etta Grandchild, MD  fluvoxaMINE (LUVOX) 100 MG tablet Take 1 tablet (100 mg total) by mouth 2 (two) times daily. 07/01/12  Yes Etta Grandchild, MD  naproxen sodium (ANAPROX) 220 MG tablet Take 220 mg by mouth 2 (two) times daily with a meal.   Yes Historical Provider, MD   Past Medical History  Diagnosis Date  . Emphysema of lung   . Depression   . Hernia 2007,1970,1976,1996,2000  . Allergy   . Cataract   . Neuromuscular disorder   . DVT (deep venous thrombosis) 1993   Past Surgical History  Procedure Laterality Date  . Hemorrhoid removed    . Bladder stretch    . Hernia repair      1974  . Hernia repair  1992  .  Hernia repair  1996  . Hernia repair  2007  . Hernia repair  2012    subxyphoid hernia with component separation  . Eye surgery Left     Lasix eye   Family History  Problem Relation Age of Onset  . Arthritis Mother   . Cancer Neg Hx   . Heart disease Neg Hx   . Hyperlipidemia Neg Hx   . Hypertension Neg Hx   . Stroke Neg Hx   . Kidney disease Father   . Diabetes Sister    History   Social History  . Marital Status: Married    Spouse Name: N/A    Number of Children: N/A  . Years of Education: N/A   Occupational History  . retired    Social History Main Topics  . Smoking status: Former Smoker -- 1.50 packs/day for 40 years    Quit date: 10/12/2005  . Smokeless tobacco: Never Used  . Alcohol Use: No  . Drug Use: No  . Sexual Activity: Not Currently   Other Topics Concern  . Not on file   Social History Narrative   ROS-see HPI Constitutional:   No-   weight loss, night sweats, fevers, chills, fatigue, lassitude. HEENT:   No-  headaches, difficulty swallowing, tooth/dental problems, sore throat,       No-  sneezing, itching, ear ache, +nasal congestion, post nasal drip,  CV:  No-   chest pain, orthopnea, PND, swelling in lower extremities, anasarca,  dizziness, palpitations Resp: + shortness of breath with exertion or at rest.              No-   productive cough,  No non-productive cough,  No- coughing up of blood.              No-   change in color of mucus.  No- wheezing.   Skin: No-   rash or lesions. GI:  No-   heartburn, indigestion, +abdominal pain, nausea, vomiting, diarrhea,                 change in bowel habits, loss of appetite GU: No-   dysuria, change in color of urine, no urgency or frequency.  No- flank pain. MS:  No-   joint pain or swelling.  No- decreased range of motion.  No- back pain. Neuro-     nothing unusual Psych:  No- change in mood or affect. + depression or anxiety.  No memory loss.  OBJ- Physical  Exam General- Alert, Oriented, Affect-appropriate, Distress- none acute Skin- rash-none, lesions- none, excoriation- none Lymphadenopathy- none Head- atraumatic            Eyes- Gross vision intact, PERRLA, conjunctivae and secretions clear            Ears- Hearing, canals-normal            Nose- + mild crusting, no-Septal dev, mucus, polyps, erosion, perforation             Throat- Mallampati II , mucosa clear , drainage- none, tonsils- atrophic Neck- flexible , trachea midline, no stridor , thyroid nl, carotid no bruit Chest - symmetrical excursion , unlabored           Heart/CV- RRR , no murmur , no gallop  , no rub, nl s1 s2                           - JVD- none , edema- none, stasis changes- none, varices- none           Lung- + diminished, unlabored clear to P&A, wheeze- none, cough- none , dullness-none, rub- none           Chest wall-  Abd- tender-no, distended-no, bowel sounds-present, HSM- no Br/ Gen/ Rectal- Not done, not indicated Extrem- cyanosis- none, clubbing, none, atrophy- none, strength- nl Neuro- + facial tics, slight resting tremor both hands

## 2014-08-18 ENCOUNTER — Ambulatory Visit: Payer: Commercial Managed Care - HMO | Admitting: Internal Medicine

## 2014-08-18 ENCOUNTER — Ambulatory Visit: Payer: Commercial Managed Care - HMO

## 2014-08-20 ENCOUNTER — Telehealth: Payer: Self-pay | Admitting: Internal Medicine

## 2014-08-20 NOTE — Telephone Encounter (Signed)
Just FYI - Patient's spouse called stating the patient will no longer be coming to see Dr. Yetta BarreJones.

## 2014-08-20 NOTE — Telephone Encounter (Signed)
Noted and chart updated to reflect PCP change

## 2014-11-07 DIAGNOSIS — F42 Obsessive-compulsive disorder: Secondary | ICD-10-CM | POA: Diagnosis not present

## 2015-02-15 DIAGNOSIS — F42 Obsessive-compulsive disorder: Secondary | ICD-10-CM | POA: Diagnosis not present

## 2015-05-08 DIAGNOSIS — F429 Obsessive-compulsive disorder, unspecified: Secondary | ICD-10-CM | POA: Diagnosis not present

## 2015-08-06 DIAGNOSIS — J069 Acute upper respiratory infection, unspecified: Secondary | ICD-10-CM | POA: Diagnosis not present

## 2015-08-07 DIAGNOSIS — F429 Obsessive-compulsive disorder, unspecified: Secondary | ICD-10-CM | POA: Diagnosis not present

## 2015-10-14 DIAGNOSIS — Z0100 Encounter for examination of eyes and vision without abnormal findings: Secondary | ICD-10-CM | POA: Diagnosis not present

## 2016-03-30 DIAGNOSIS — F429 Obsessive-compulsive disorder, unspecified: Secondary | ICD-10-CM | POA: Diagnosis not present

## 2017-05-02 DIAGNOSIS — R109 Unspecified abdominal pain: Secondary | ICD-10-CM | POA: Diagnosis not present

## 2017-05-02 DIAGNOSIS — R1110 Vomiting, unspecified: Secondary | ICD-10-CM | POA: Diagnosis not present

## 2017-05-06 ENCOUNTER — Encounter (HOSPITAL_COMMUNITY): Payer: Self-pay | Admitting: Emergency Medicine

## 2017-05-06 ENCOUNTER — Inpatient Hospital Stay (HOSPITAL_COMMUNITY)
Admission: EM | Admit: 2017-05-06 | Discharge: 2017-05-17 | DRG: 337 | Disposition: A | Discharge status: Home / Self Care | Payer: Medicare HMO | Attending: Internal Medicine | Admitting: Internal Medicine

## 2017-05-06 ENCOUNTER — Emergency Department (HOSPITAL_COMMUNITY): Payer: Medicare HMO

## 2017-05-06 DIAGNOSIS — K219 Gastro-esophageal reflux disease without esophagitis: Secondary | ICD-10-CM | POA: Diagnosis present

## 2017-05-06 DIAGNOSIS — J4489 Other specified chronic obstructive pulmonary disease: Secondary | ICD-10-CM

## 2017-05-06 DIAGNOSIS — F319 Bipolar disorder, unspecified: Secondary | ICD-10-CM | POA: Diagnosis present

## 2017-05-06 DIAGNOSIS — R197 Diarrhea, unspecified: Secondary | ICD-10-CM | POA: Diagnosis not present

## 2017-05-06 DIAGNOSIS — R410 Disorientation, unspecified: Secondary | ICD-10-CM | POA: Diagnosis not present

## 2017-05-06 DIAGNOSIS — Z87891 Personal history of nicotine dependence: Secondary | ICD-10-CM

## 2017-05-06 DIAGNOSIS — Z79899 Other long term (current) drug therapy: Secondary | ICD-10-CM | POA: Diagnosis not present

## 2017-05-06 DIAGNOSIS — K4020 Bilateral inguinal hernia, without obstruction or gangrene, not specified as recurrent: Secondary | ICD-10-CM | POA: Diagnosis present

## 2017-05-06 DIAGNOSIS — R110 Nausea: Secondary | ICD-10-CM | POA: Diagnosis not present

## 2017-05-06 DIAGNOSIS — K563 Gallstone ileus: Secondary | ICD-10-CM | POA: Diagnosis not present

## 2017-05-06 DIAGNOSIS — K56609 Unspecified intestinal obstruction, unspecified as to partial versus complete obstruction: Secondary | ICD-10-CM | POA: Diagnosis not present

## 2017-05-06 DIAGNOSIS — R9431 Abnormal electrocardiogram [ECG] [EKG]: Secondary | ICD-10-CM | POA: Diagnosis not present

## 2017-05-06 DIAGNOSIS — Z888 Allergy status to other drugs, medicaments and biological substances status: Secondary | ICD-10-CM

## 2017-05-06 DIAGNOSIS — J449 Chronic obstructive pulmonary disease, unspecified: Secondary | ICD-10-CM | POA: Diagnosis not present

## 2017-05-06 DIAGNOSIS — Z4682 Encounter for fitting and adjustment of non-vascular catheter: Secondary | ICD-10-CM | POA: Diagnosis not present

## 2017-05-06 DIAGNOSIS — R509 Fever, unspecified: Secondary | ICD-10-CM | POA: Diagnosis not present

## 2017-05-06 DIAGNOSIS — K565 Intestinal adhesions [bands], unspecified as to partial versus complete obstruction: Secondary | ICD-10-CM | POA: Diagnosis not present

## 2017-05-06 DIAGNOSIS — R109 Unspecified abdominal pain: Secondary | ICD-10-CM | POA: Diagnosis not present

## 2017-05-06 DIAGNOSIS — K5651 Intestinal adhesions [bands], with partial obstruction: Secondary | ICD-10-CM | POA: Diagnosis present

## 2017-05-06 DIAGNOSIS — K2970 Gastritis, unspecified, without bleeding: Secondary | ICD-10-CM | POA: Diagnosis not present

## 2017-05-06 DIAGNOSIS — K5669 Other partial intestinal obstruction: Secondary | ICD-10-CM | POA: Diagnosis not present

## 2017-05-06 DIAGNOSIS — R0602 Shortness of breath: Secondary | ICD-10-CM | POA: Diagnosis not present

## 2017-05-06 DIAGNOSIS — R0600 Dyspnea, unspecified: Secondary | ICD-10-CM | POA: Diagnosis not present

## 2017-05-06 DIAGNOSIS — R1110 Vomiting, unspecified: Secondary | ICD-10-CM | POA: Diagnosis not present

## 2017-05-06 DIAGNOSIS — Z0189 Encounter for other specified special examinations: Secondary | ICD-10-CM | POA: Diagnosis not present

## 2017-05-06 DIAGNOSIS — K561 Intussusception: Secondary | ICD-10-CM | POA: Diagnosis not present

## 2017-05-06 DIAGNOSIS — Z4659 Encounter for fitting and adjustment of other gastrointestinal appliance and device: Secondary | ICD-10-CM

## 2017-05-06 DIAGNOSIS — R1011 Right upper quadrant pain: Secondary | ICD-10-CM | POA: Diagnosis not present

## 2017-05-06 DIAGNOSIS — R531 Weakness: Secondary | ICD-10-CM | POA: Diagnosis not present

## 2017-05-06 DIAGNOSIS — R0989 Other specified symptoms and signs involving the circulatory and respiratory systems: Secondary | ICD-10-CM | POA: Diagnosis not present

## 2017-05-06 DIAGNOSIS — N209 Urinary calculus, unspecified: Secondary | ICD-10-CM | POA: Diagnosis not present

## 2017-05-06 DIAGNOSIS — R911 Solitary pulmonary nodule: Secondary | ICD-10-CM | POA: Diagnosis not present

## 2017-05-06 LAB — URINALYSIS, ROUTINE W REFLEX MICROSCOPIC
Bilirubin Urine: NEGATIVE
GLUCOSE, UA: NEGATIVE mg/dL
Hgb urine dipstick: NEGATIVE
KETONES UR: NEGATIVE mg/dL
LEUKOCYTES UA: NEGATIVE
NITRITE: NEGATIVE
PROTEIN: NEGATIVE mg/dL
Specific Gravity, Urine: >1.046 — ABNORMAL HIGH (ref 1.005–1.030)
pH: 7 (ref 5.0–8.0)

## 2017-05-06 LAB — COMPREHENSIVE METABOLIC PANEL
ALBUMIN: 3.8 g/dL (ref 3.5–5.0)
ALK PHOS: 96 U/L (ref 38–126)
ALT: 14 U/L — ABNORMAL LOW (ref 17–63)
AST: 21 U/L (ref 15–41)
Anion gap: 7 (ref 5–15)
BILIRUBIN TOTAL: 1.7 mg/dL — AB (ref 0.3–1.2)
BUN: 14 mg/dL (ref 6–20)
CALCIUM: 8.9 mg/dL (ref 8.9–10.3)
CO2: 25 mmol/L (ref 22–32)
CREATININE: 1.18 mg/dL (ref 0.61–1.24)
Chloride: 105 mmol/L (ref 101–111)
GFR calc Af Amer: >60 mL/min (ref >60)
GFR, EST NON AFRICAN AMERICAN: 57 mL/min — AB (ref >60)
GLUCOSE: 130 mg/dL — AB (ref 65–99)
POTASSIUM: 4.1 mmol/L (ref 3.5–5.1)
Sodium: 137 mmol/L (ref 135–145)
TOTAL PROTEIN: 6.6 g/dL (ref 6.5–8.1)

## 2017-05-06 LAB — CBC WITH DIFFERENTIAL/PLATELET
BASOS ABS: 0 10*3/uL (ref 0.0–0.1)
BASOS PCT: 0 %
EOS ABS: 0 10*3/uL (ref 0.0–0.7)
EOS PCT: 0 %
HCT: 42.6 % (ref 39.0–52.0)
Hemoglobin: 14 g/dL (ref 13.0–17.0)
Lymphocytes Relative: 6 %
Lymphs Abs: 0.7 10*3/uL (ref 0.7–4.0)
MCH: 31 pg (ref 26.0–34.0)
MCHC: 32.9 g/dL (ref 30.0–36.0)
MCV: 94.5 fL (ref 78.0–100.0)
MONO ABS: 0.6 10*3/uL (ref 0.1–1.0)
Monocytes Relative: 5 %
Neutro Abs: 10.7 10*3/uL — ABNORMAL HIGH (ref 1.7–7.7)
Neutrophils Relative %: 89 %
PLATELETS: 241 10*3/uL (ref 150–400)
RBC: 4.51 MIL/uL (ref 4.22–5.81)
RDW: 13.1 % (ref 11.5–15.5)
WBC: 12 10*3/uL — AB (ref 4.0–10.5)

## 2017-05-06 LAB — I-STAT CG4 LACTIC ACID, ED: Lactic Acid, Venous: 1.17 mmol/L (ref 0.5–1.9)

## 2017-05-06 LAB — LIPASE, BLOOD: LIPASE: 27 U/L (ref 11–51)

## 2017-05-06 LAB — I-STAT TROPONIN, ED: TROPONIN I, POC: 0 ng/mL (ref 0.00–0.08)

## 2017-05-06 MED ORDER — IOPAMIDOL (ISOVUE-300) INJECTION 61%
INTRAVENOUS | Status: AC
  Administered 2017-05-06: 100 mL via INTRAVENOUS
  Filled 2017-05-06: qty 100

## 2017-05-06 MED ORDER — ONDANSETRON HCL 4 MG/2ML IJ SOLN
4.0000 mg | Freq: Four times a day (QID) | INTRAMUSCULAR | Status: DC | PRN
  Administered 2017-05-06 – 2017-05-08 (×2): 4 mg via INTRAVENOUS
  Filled 2017-05-06 (×2): qty 2

## 2017-05-06 MED ORDER — ENOXAPARIN SODIUM 40 MG/0.4ML ~~LOC~~ SOLN
40.0000 mg | SUBCUTANEOUS | Status: DC
  Administered 2017-05-06 – 2017-05-10 (×5): 40 mg via SUBCUTANEOUS
  Filled 2017-05-06 (×5): qty 0.4

## 2017-05-06 MED ORDER — ONDANSETRON HCL 4 MG/2ML IJ SOLN
4.0000 mg | Freq: Once | INTRAMUSCULAR | Status: AC
  Administered 2017-05-06: 4 mg via INTRAVENOUS
  Filled 2017-05-06: qty 2

## 2017-05-06 MED ORDER — ACETAMINOPHEN 650 MG RE SUPP: 650.0000 mg | Freq: Four times a day (QID) | RECTAL | Status: DC | PRN

## 2017-05-06 MED ORDER — FENTANYL CITRATE (PF) 100 MCG/2ML IJ SOLN
50.0000 ug | Freq: Once | INTRAMUSCULAR | Status: AC
  Administered 2017-05-06: 50 ug via INTRAVENOUS
  Filled 2017-05-06: qty 2

## 2017-05-06 MED ORDER — ACETAMINOPHEN 325 MG PO TABS: 650.0000 mg | ORAL_TABLET | Freq: Four times a day (QID) | ORAL | Status: DC | PRN

## 2017-05-06 MED ORDER — SODIUM CHLORIDE 0.9 % IV SOLN
INTRAVENOUS | Status: DC
  Administered 2017-05-06 – 2017-05-10 (×6): via INTRAVENOUS

## 2017-05-06 MED ORDER — ALBUTEROL SULFATE (2.5 MG/3ML) 0.083% IN NEBU: 2.5000 mg | INHALATION_SOLUTION | RESPIRATORY_TRACT | Status: DC | PRN

## 2017-05-06 MED ORDER — IOPAMIDOL (ISOVUE-300) INJECTION 61%
100.0000 mL | Freq: Once | INTRAVENOUS | Status: AC | PRN
  Administered 2017-05-06: 100 mL via INTRAVENOUS

## 2017-05-06 MED ORDER — PANTOPRAZOLE SODIUM 40 MG IV SOLR
40.0000 mg | INTRAVENOUS | Status: DC
  Administered 2017-05-06 – 2017-05-15 (×10): 40 mg via INTRAVENOUS
  Filled 2017-05-06 (×10): qty 40

## 2017-05-06 MED ORDER — ONDANSETRON HCL 4 MG PO TABS: 4.0000 mg | ORAL_TABLET | Freq: Four times a day (QID) | ORAL | Status: DC | PRN

## 2017-05-06 NOTE — ED Notes (Signed)
Hospitalist at bedside 

## 2017-05-06 NOTE — ED Notes (Signed)
Assigned 1530 @ 17:52 call report @ 18:22

## 2017-05-06 NOTE — Consult Note (Signed)
Re:   Christian Hurley DOB:   08/11/1940 MRN:   [id number]   [facility] Consultation  Chief Complaint Abdominal pain  ASSESEMENT AND PLAN: 1.  SBO - probably secondary to adhesions  Not much in stomach on CT scan - will hold off NGT, unless he vomits ... Repeat KUB in AM  2. Bilateral recurrent inguinal hernias  Left larger than right  Not the source of the bowel obstruction  3.  COPD  Has seen Dr. Melba Coon. Young in the past  4.  Bipolar disease  On Lamictal and depakote for this  Sees [facility] in [place] for psych 5.  GERD 6.  History of bladder surgery - about 10 years ago - not sure what it was  Chief Complaint  Patient presents with  . Abdominal Pain   PHYSICIAN REQUESTING CONSULTATION:  Dr. Dalene SeltzerSchlossman, [facility]  HISTORY OF PRESENT ILLNESS: Christian Dieselarl W Rajan is a 76 y.o. (DOB: 08/11/1940)  white male whose primary care physician is [name], Aaron, MD.   The patient got sick last night and vomited a couple times.  He vomited again this morning and went to the [facility] walk-in [facility].  They referred him to the [facility] emergency room and he was brought here by ambulance.  He told me his symptoms began last night, but told Dr. Sharl MaLama his abdominal pain began 3 days ago.  He has had multiple abdominal operations for an abdominal wall hernia.  He had abdominal wall hernia repairs in 2002 ([facility]) and 2007 ([facility] - Alloderm and Prolene).  He had a repair of recurrent incisional hernia with component separation Dwain Sarna- [place] - 12/16/2010 - used Physiomesh.  Since his last hernia repair, he has done okay.  He does have gastroesophageal reflux disease.  He has been told he has gallstones, but the CT scan notes an absent gallbladder.  He does not remember having gallbladder surgery.  He had a colonoscopy about 10 years ago by Dr. Ewing SchleinMagod.  He has had bilateral inguinal hernia repairs in the remote past.  CT scan of abdomen - 05/06/2017 - 1. Partial small  bowel obstruction LEFT upper quadrant with small intussusception, and probable adhesions. Small volume ascites. 2. Status post cholecystectomy. 3. Colonic diverticulosis without acute diverticulitis.  Past Medical History:  Diagnosis Date  . Allergy   . Cataract   . Depression   . DVT (deep venous thrombosis) (HCC) 1993  . Emphysema of lung (HCC)   . Hernia 2007,1970,1976,1996,2000  . Neuromuscular disorder [facility](HCC)       Past Surgical History:  Procedure Laterality Date  . BLADDER STRETCH    . EYE SURGERY Left    Lasix eye  . HEMORRHOID REMOVED    . HERNIA REPAIR     1974  . HERNIA REPAIR  1992  . HERNIA REPAIR  1996  . HERNIA REPAIR  2007  . HERNIA REPAIR  2012   subxyphoid hernia with component separation      Current Facility-Administered Medications  Medication Dose Route Frequency Provider Last Rate Last Dose  . 0.9 %  sodium chloride infusion   Intravenous Continuous Sharl MaLama, Sarina IllGagan S, MD      . acetaminophen (TYLENOL) tablet 650 mg  650 mg Oral Q6H PRN Meredeth IdeLama, Gagan S, MD       Or  . acetaminophen (TYLENOL) suppository 650 mg  650 mg Rectal Q6H PRN Meredeth IdeLama, Gagan S, MD      . albuterol (PROVENTIL HFA;VENTOLIN HFA) 108 (90  Base) MCG/ACT inhaler 1-2 puff  1-2 puff Inhalation Q4H PRN Sharl MaLama, Sarina IllGagan S, MD      . enoxaparin (LOVENOX) injection 40 mg  40 mg Subcutaneous Q24H [place], Gagan S, MD      . ondansetron (ZOFRAN) tablet 4 mg  4 mg Oral Q6H PRN Meredeth IdeLama, Gagan S, MD       Or  . ondansetron (ZOFRAN) injection 4 mg  4 mg Intravenous Q6H PRN Meredeth IdeLama, Gagan S, MD      . pantoprazole (PROTONIX) injection 40 mg  40 mg Intravenous Q24H Meredeth IdeLama, Gagan S, MD          Allergies  Allergen Reactions  . Nitrofurantoin Monohyd Macro     REVIEW OF SYSTEMS: Skin:  No history of rash.  No history of abnormal moles. Infection:  No history of hepatitis or HIV.  No history of MRSA. Neurologic:  History of bipolar disease.  He sees [facility] in [place]. Cardiac:  No history of hypertension. No  history of heart disease.  No history of prior cardiac catheterization.  No history of seeing a cardiologist. Pulmonary:  COPD  Endocrine:  No diabetes. No thyroid disease. Gastrointestinal:  See HPI Urologic:  Prior bladder surgery - unknown type or doctor. Musculoskeletal:  No history of joint or back disease. Hematologic:  No bleeding disorder.  No history of anemia.  Not anticoagulated. Psycho-social:  The patient is oriented.   History of bipolar disease.  SOCIAL and FAMILY HISTORY: Married.  Wife, Britta MccreedyBarbara. No children. 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  Retired.   PHYSICAL EXAM: BP 125/69   Pulse 88   Temp 99.9 F (37.7 C) (Rectal)   Resp (!) 23   Ht 5\' 9"  (1.753 m)   Wt 84.8 kg (187 lb)   SpO2 95%   BMI 27.62 kg/m   General: WN bearded older WM who is alert and generally healthy appearing.   He has this tic when he talks and moves. Skin:  Inspection and palpation - no mass or rash. Eyes:  Conjunctiva and lids unremarkable.            Pupils are equal Ears, Nose, Mouth, and Throat:  Ears and nose unremarkable            Lips and teeth are unremarable. Neck: Supple. No mass, trachea midline.  No thyroid mass. Lymph Nodes:  No supraclavicular, cervical, or inguinal nodes. Lungs: Normal respiratory effort.  Clear to auscultation and symmetric breath sounds. Heart:  Palpation of the heart is normal.            Auscultation: RRR. No murmur or rub.  Abdomen: Distended.  Midline scar.  Thinned abdominal wall in the upper half of the abdomen.  BS decreased.  Bilateral inguinal hernias - L>R.  No rebound or tenderness. Rectal: Not done. Musculoskeletal:  Good muscle strength and ROM  in upper and lower extremities. Neurologic:  Grossly intact to motor and sensory function. Psychiatric: Normal judgement and insight. Behavior is normal.            Oriented to time, person, place.   DATA REVIEWED, COUNSELING AND  COORDINATION OF CARE: Epic notes reviewed. Counseling and coordination of care exceeded more than 50% of the time spent with patient. Total time spent with patient and charting: 60 minutes  Ovidio Kinavid Nesta Kimple, MD,  [facility] [address]  [address]   [place], [place]  [address] Phone:  [phone] FAX:  [phone]

## 2017-05-06 NOTE — H&P (Signed)
TRH H&P    Patient Demographics:    Christian Hurley, is a 76 y.o. male  MRN: [id number]  DOB - [birth year]  Admit Date - 05/06/2017    Outpatient Primary MD for the patient is System, Pcp Not In  Patient coming from: [facility] walk[facility]  Chief Complaint  Patient presents with  . Abdominal Pain      HPI:    Christian Hurley  is a 76 y.o. male,.. With history of COPD, bipolar disorder was sent to ED from [facility] walk[facility] for abdominal pain.  Patient says that he has had 3 days of abdominal pain and loose stools.  Last BM was around 10 AM this morning.  Patient also has been having vomiting unable to eat and drink.  He denies any blood in the vomitus.  Patient has had ventral hernia repair in the past.  Planes of intermittent abdominal pain, radiates from lower abdomen towards chest. Denies chest pain or shortness of breath. History of COPD, does not use home oxygen. He denies dysuria, urgency or frequency of urination. No previous history of stroke or seizure. No history of CAD  The ED, CT abdomen/pelvis was done which showed partial small bowel obstruction, left upper quadrant with small intussusception and probable adhesions.  General surgery was consulted by the ED physician.   Review of systems:     All other systems reviewed and are negative.   With Past History of the following :    Past Medical History:  Diagnosis Date  . Allergy   . Cataract   . Depression   . DVT (deep venous thrombosis) (HCC) 1993  . Emphysema of lung (HCC)   . Hernia 2007,1970,1976,1996,2000  . Neuromuscular disorder [facility](HCC)       Past Surgical History:  Procedure Laterality Date  . BLADDER STRETCH    . EYE SURGERY Left    Lasix eye  . HEMORRHOID REMOVED    . HERNIA REPAIR     1974  . HERNIA REPAIR  1992  . HERNIA REPAIR  1996  . HERNIA REPAIR  2007  . HERNIA REPAIR  2012   subxyphoid hernia with  component separation      Social History:      Social History   Tobacco Use  . Smoking status: Former Smoker    Packs/day: 1.50    Years: 40.00    Pack years: 60.00    Last attempt to quit: 10/12/2005    Years since quitting: 11.5  . Smokeless tobacco: Never Used  Substance Use Topics  . Alcohol use: No    Alcohol/week: 0.0 oz       Family History :     Family History  Problem Relation Age of Onset  . Arthritis Mother   . Kidney disease Father   . Diabetes Sister   . Cancer Neg Hx   . Heart disease Neg Hx   . Hyperlipidemia Neg Hx   . Hypertension Neg Hx   . Stroke Neg Hx       Home Medications:  Prior to Admission medications   Medication Sig Start Date End Date Taking? Authorizing Provider  acetaminophen (TYLENOL) 500 MG tablet Take 1,000 mg by mouth every 6 (six) hours as needed for mild pain or headache.   Yes [provider]  fluvoxaMINE (LUVOX) 100 MG tablet Take 1 tablet (100 mg total) by mouth 2 (two) times daily. 07/01/12  Yes Etta Grandchild, MD  lamoTRIgine (LAMICTAL) 100 MG tablet Take 100 mg by mouth 2 (two) times daily.   Yes [provider]  naproxen sodium (ANAPROX) 220 MG tablet Take 220 mg by mouth 2 (two) times daily with a meal.   Yes [provider]  omeprazole (PRILOSEC) 20 MG capsule Take 20 mg by mouth daily.   Yes [provider]  pramipexole (MIRAPEX) 0.125 MG tablet Take 0.125 mg by mouth at bedtime. 03/06/17  Yes [provider]  divalproex (DEPAKOTE ER) 500 MG 24 hr tablet Take 1 tablet (500 mg total) by mouth daily. Patient not taking: Reported on 05/06/2017 07/01/12   Etta Grandchild, MD     Allergies:     Allergies  Allergen Reactions  . Nitrofurantoin Monohyd Macro      Physical Exam:   Vitals  Blood pressure 111/66, pulse 74, temperature 99.9 F (37.7 C), temperature source Rectal, resp. rate (!) 25, height 5\' 9"  (1.753 m), weight 84.8 kg (187 lb), SpO2 97 %.  1.   General: Appears in no acute distress  2. Psychiatric:  Intact judgement and  insight, awake alert, oriented x 3.  3. Neurologic: No focal neurological deficits, all cranial nerves intact.Strength 5/5 all 4 extremities, sensation intact all 4 extremities, plantars down going.  4. Eyes :  anicteric sclerae, moist conjunctivae with no lid lag. PERRLA.  5. ENMT:  Oropharynx clear with moist mucous membranes and good dentition  6. Neck:  supple, no cervical lymphadenopathy appriciated, No thyromegaly  7. Respiratory : Normal respiratory effort, good air movement bilaterally,clear to  auscultation bilaterally  8. Cardiovascular : RRR, no gallops, rubs or murmurs, no leg edema  9. Gastrointestinal:  Positive bowel sounds, abdomen soft, positive tender to palpation in the umbilical region, positive guarding, no rigidity.  10. Skin:  No cyanosis, normal texture and turgor, no rash, lesions or ulcers  11.Musculoskeletal:  Good muscle tone,  joints appear normal , no effusions,  normal range of motion    Data Review:    CBC Recent Labs  Lab 05/06/17 1415  WBC 12.0*  HGB 14.0  HCT 42.6  PLT 241  MCV 94.5  MCH 31.0  MCHC 32.9  RDW 13.1  LYMPHSABS 0.7  MONOABS 0.6  EOSABS 0.0  BASOSABS 0.0   ------------------------------------------------------------------------------------------------------------------  Chemistries  Recent Labs  Lab 05/06/17 1415  NA 137  K 4.1  CL 105  CO2 25  GLUCOSE 130*  BUN 14  CREATININE 1.18  CALCIUM 8.9  AST 21  ALT 14*  ALKPHOS 96  BILITOT 1.7*   ------------------------------------------------------------------------------------------------------------------  ------------------------------------------------------------------------------------------------------------------ GFR: Estimated Creatinine Clearance: 51.6 mL/min (by C-G formula based on SCr of 1.18 mg/dL). Liver Function Tests: Recent Labs  Lab 05/06/17 1415  AST  21  ALT 14*  ALKPHOS 96  BILITOT 1.7*  PROT 6.6  ALBUMIN 3.8   Recent Labs  Lab 05/06/17 1415  LIPASE 27    --------------------------------------------------------------------------------------------------------------- Urine analysis:    Component Value Date/Time   COLORURINE YELLOW 07/31/2012 1017   APPEARANCEUR CLEAR 07/31/2012 1017   LABSPEC >=1.030 07/31/2012 1017   PHURINE 6.0  07/31/2012 1017   GLUCOSEU NEGATIVE 07/31/2012 1017   HGBUR NEGATIVE 07/31/2012 1017   BILIRUBINUR NEGATIVE 07/31/2012 1017   KETONESUR TRACE 07/31/2012 1017   UROBILINOGEN 0.2 07/31/2012 1017   NITRITE NEGATIVE 07/31/2012 1017   LEUKOCYTESUR NEGATIVE 07/31/2012 1017      Imaging Results:    Ct Abdomen Pelvis W Contrast  Result Date: 05/06/2017 CLINICAL DATA:  Abdominal pain since yesterday, nausea. History of gallstones and recurrent hernia. EXAM: CT ABDOMEN AND PELVIS WITH CONTRAST TECHNIQUE: Multidetector CT imaging of the abdomen and pelvis was performed using the standard protocol following bolus administration of intravenous contrast. CONTRAST:  100mL ISOVUE-300 IOPAMIDOL (ISOVUE-300) INJECTION 61% COMPARISON:  CT abdomen and pelvis September 07, 2010 FINDINGS: LOWER CHEST: Lung bases are clear. Included heart size is normal. No pericardial effusion. Bilateral fat containing small diaphragm attic hernia is are unchanged. Mild bronchial wall thickening. HEPATOBILIARY: Homogeneously hypodense cysts in the liver measuring to 3.4 cm. Status post cholecystectomy. PANCREAS: Normal. SPLEEN: Normal. ADRENALS/URINARY TRACT: Kidneys are orthotopic, demonstrating symmetric enhancement. No nephrolithiasis, hydronephrosis or solid renal masses. Too small to characterize hypodensities RIGHT kidney. 1 cm cyst lower pole LEFT kidney. The unopacified ureters are normal in course and caliber. Delayed imaging through the kidneys demonstrates symmetric prompt contrast excretion within the proximal urinary collecting  system. Urinary bladder is partially distended, dome of the bladder extends toward but not into the RIGHT inguinal hernia. Normal adrenal glands. STOMACH/BOWEL: The dilated small bowel in LEFT upper quadrant measuring 2 3.7 cm with air-fluid levels and small bowel feces. Transition point LEFT upper quadrant a small intussusception. Associated mesenteric edema. Moderate descending and sigmoid colonic diverticulosis. VASCULAR/LYMPHATIC: Aortoiliac vessels are normal in course and caliber. Moderate calcific atherosclerosis. Chronic infrarenal aortic dissection. No lymphadenopathy by CT size criteria. REPRODUCTIVE: Normal. OTHER: Small volume ascites LEFT upper quadrant and pelvis. No drainable fluid collection. No intraperitoneal free air. Moderate LEFT and small RIGHT inguinal fat containing hernias with minimal free fluid. MUSCULOSKELETAL: Nonacute. Anterior abdominal wall scarring. Severe degenerative change of the lower lumbar spine. IMPRESSION: 1. Partial small bowel obstruction LEFT upper quadrant with small intussusception, and probable adhesions. Small volume ascites. 2. Status post cholecystectomy. 3. Colonic diverticulosis without acute diverticulitis. Electronically Signed   By: Awilda Metroourtnay  Bloomer M.D.   On: 05/06/2017 15:53    My personal review of EKG: Rhythm NSR   Assessment & Plan:    Active Problems:   Small bowel obstruction (HCC)   1. Partial small bowel obstruction-CT abdomen shows partial small bowel obstruction with small intussusception and probable adhesions.  Will keep patient n.p.o.  Zofran as needed for nausea and vomiting.  Continue IV normal saline at 100 mL/h.  General surgery has been consulted. 2. COPD-stable, no exacerbation.  Continue as needed albuterol inhaler. 3. Bipolar disorder-we will hold Lamictal, Luvox. 4. GERD-patient takes omeprazole at home, will start  IV Protonix   DVT Prophylaxis-   Lovenox  AM Labs Ordered, also please review Full Orders  Family  Communication: Admission, patients condition and plan of care including tests being ordered have been discussed with the patient  who indicate understanding and agree with the plan and Code Status.  Code Status: Full code  Admission status: Inpatient  Time spent in minutes : 60 minutes  Meredeth IdeGagan S Jacori Mulrooney M.D on 05/06/2017 at 5:47 PM  Between 7am to 7pm - Pager - [phone]. After 7pm go to [email or web address] - password [email or web address]  [facility] - Office  [phone]

## 2017-05-06 NOTE — ED Notes (Signed)
Patient transported to CT 

## 2017-05-06 NOTE — ED Notes (Signed)
Per patient request, patient's wife made aware patient will be admitted to 1530.

## 2017-05-06 NOTE — ED Notes (Signed)
Second set of blood cultures sent to the lab.

## 2017-05-06 NOTE — ED Provider Notes (Signed)
[facility] GENER[facility]GERY Provider Note   CSN: [id number] Arrival date & time: 05/06/17  1319     History   Chief Complaint Chief Complaint  Patient presents with  . Abdominal Pain    HPI Christian Hurley is a 76 y.o. male who presents to the Ed via EMS for abdominal pain. He was seen earlier at the [facility] walk in clinic and sent emergently for evaluation. The patient and his wiofe give the hx and are both poor historians. Hx is also gathered from hand written note by UC. The patient has had 3 days of abdominal pain, loose stools, unable to eat or drink and nausea. He has had 3 episodes of NBNB vomiting. He has known gallstones. He has had ventral hernia repairs he c/o 10/10 pain but is unable to localize his sx.  He states that he has had difficulty urinating, but denies urgency, frequency, dysuria.  He has a known Left inguinal hernia. He has a hx of COPD. He deneis cp or SOB. He denies fevers, foreign travel, eating suspicious foods. No contacts with similar sxs.\   HPI  Past Medical History:  Diagnosis Date  . Allergy   . Cataract   . Depression   . DVT (deep venous thrombosis) (HCC) 1993  . Emphysema of lung (HCC)   . Hernia 2007,1970,1976,1996,2000  . Neuromuscular disorder [facility](HCC)     Patient Active Problem List   Diagnosis Date Noted  . COPD with chronic bronchitis (HCC)   . Encounter for imaging study to confirm nasogastric (NG) tube placement   . Encounter for nasogastric (NG) tube placement   . Small bowel obstruction (HCC) 05/06/2017  . Obstructive chronic bronchitis with exacerbation (HCC) 04/23/2013  . Cough 04/23/2013  . Lung nodule, solitary 04/23/2013  . Gallstone 11/28/2012  . Other abnormal glucose 07/31/2011  . Encounter for long-term (current) use of other medications 07/31/2011  . 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 Bipolar affective (HCC) 10/13/2010  . BPH (benign prostatic hyperplasia) 10/13/2010    Past Surgical  History:  Procedure Laterality Date  . BLADDER STRETCH    . EYE SURGERY Left    Lasix eye  . HEMORRHOID REMOVED    . HERNIA REPAIR     1974  . HERNIA REPAIR  1992  . HERNIA REPAIR  1996  . HERNIA REPAIR  2007  . HERNIA REPAIR  2012   subxyphoid hernia with component separation  . LAPAROTOMY N/A 05/11/2017   Procedure: EXPLORATORY LAPAROTOMY, LYSIS OF ADHESIONS, REMOVAL OF GALLSTONE;  Surgeon: Almond LintByerly, Faera, MD;  Location: [facility];  Service: General;  Laterality: N/A;       Home Medications    Prior to Admission medications   Medication Sig Start Date End Date Taking? Authorizing Provider  acetaminophen (TYLENOL) 500 MG tablet Take 1,000 mg by mouth every 6 (six) hours as needed for mild pain or headache.   Yes [provider]  fluvoxaMINE (LUVOX) 100 MG tablet Take 1 tablet (100 mg total) by mouth 2 (two) times daily. 07/01/12  Yes Etta GrandchildJones, Thomas L, MD  lamoTRIgine (LAMICTAL) 100 MG tablet Take 100 mg by mouth 2 (two) times daily.   Yes [provider]  naproxen sodium (ANAPROX) 220 MG tablet Take 220 mg by mouth 2 (two) times daily with a meal.   Yes [provider]  omeprazole (PRILOSEC) 20 MG capsule Take 20 mg by mouth daily.   Yes [provider]  pramipexole (MIRAPEX) 0.125 MG  tablet Take 0.125 mg by mouth at bedtime. 03/06/17  Yes [provider]  divalproex (DEPAKOTE ER) 500 MG 24 hr tablet Take 1 tablet (500 mg total) by mouth daily. Patient not taking: Reported on 05/06/2017 07/01/12   Etta Grandchild, MD    Family History Family History  Problem Relation Age of Onset  . Arthritis Mother   . Kidney disease Father   . Diabetes Sister   . Cancer Neg Hx   . Heart disease Neg Hx   . Hyperlipidemia Neg Hx   . Hypertension Neg Hx   . Stroke Neg Hx     Social History Social History   Tobacco Use  . Smoking status: Former Smoker    Packs/day: 1.50    Years: 40.00    Pack years: 60.00    Last attempt to quit: 10/12/2005     Years since quitting: 11.5  . Smokeless tobacco: Never Used  Substance Use Topics  . Alcohol use: No    Alcohol/week: 0.0 oz  . Drug use: No     Allergies   Nitrofurantoin monohyd macro   Review of Systems Review of Systems  Ten systems reviewed and are negative for acute change, except as noted in the HPI.   Physical Exam Updated Vital Signs BP 134/62 (BP Location: Right Arm)   Pulse 81   Temp 98.4 F (36.9 C) (Oral)   Resp 18   Ht 5\' 9"  (1.753 m)   Wt 79 kg (174 lb 1.6 oz)   SpO2 95%   BMI 25.71 kg/m   Physical Exam  Constitutional: He appears well-developed. He appears ill.  HENT:  Head: Normocephalic and atraumatic.  dyr mucosa  Eyes: EOM are normal. Pupils are equal, round, and reactive to light.  Cardiovascular: Normal rate and regular rhythm.  Pulmonary/Chest: Effort normal and breath sounds normal.  Abdominal: There is generalized tenderness. There is rigidity and guarding. A hernia is present. Hernia confirmed positive in the left inguinal area.    Neurological:  abdnormal facial movements  Nursing note and vitals reviewed.    ED Treatments / Results  Labs (all labs ordered are listed, but only abnormal results are displayed) Labs Reviewed  COMPREHENSIVE METABOLIC PANEL - Abnormal; Notable for the following components:      Result Value   Glucose, Bld 130 (*)    ALT 14 (*)    Total Bilirubin 1.7 (*)    GFR calc non Af Amer 57 (*)    All other components within normal limits  CBC WITH DIFFERENTIAL/PLATELET - Abnormal; Notable for the following components:   WBC 12.0 (*)    Neutro Abs 10.7 (*)    All other components within normal limits  URINALYSIS, ROUTINE W REFLEX MICROSCOPIC - Abnormal; Notable for the following components:   Specific Gravity, Urine >1.046 (*)    All other components within normal limits  COMPREHENSIVE METABOLIC PANEL - Abnormal; Notable for the following components:   Glucose, Bld 107 (*)    Calcium 8.8 (*)    ALT 13  (*)    Total Bilirubin 1.6 (*)    GFR calc non Af Amer 59 (*)    All other components within normal limits  BASIC METABOLIC PANEL - Abnormal; Notable for the following components:   BUN 22 (*)    Calcium 8.8 (*)    All other components within normal limits  BASIC METABOLIC PANEL - Abnormal; Notable for the following components:   Calcium 8.7 (*)  All other components within normal limits  BASIC METABOLIC PANEL - Abnormal; Notable for the following components:   BUN 21 (*)    Calcium 8.6 (*)    All other components within normal limits  BASIC METABOLIC PANEL - Abnormal; Notable for the following components:   CO2 21 (*)    Calcium 8.5 (*)    All other components within normal limits  CBC - Abnormal; Notable for the following components:   RBC 3.98 (*)    Hemoglobin 12.5 (*)    HCT 37.4 (*)    All other components within normal limits  BASIC METABOLIC PANEL - Abnormal; Notable for the following components:   Calcium 8.3 (*)    All other components within normal limits  CULTURE, BLOOD (ROUTINE X 2)  CULTURE, BLOOD (ROUTINE X 2)  LIPASE, BLOOD  CBC  CBC  MAGNESIUM  CBC  CREATININE, SERUM  I-STAT CG4 LACTIC ACID, ED  I-STAT TROPONIN, ED  SAMPLE TO BLOOD BANK  SURGICAL PATHOLOGY    EKG  EKG Interpretation  Date/Time:  Sunday May 06 2017 14:09:52 EST Ventricular Rate:  81 PR Interval:    QRS Duration: 85 QT Interval:  355 QTC Calculation: 412 R Axis:   50 Text Interpretation:  Sinus rhythm Probable left atrial enlargement Low voltage, precordial leads Confirmed by Tilden Fossaees, Elizabeth [phone]) on 05/07/2017 6:47:55 PM       Radiology Dg Chest 2 View  Result Date: 05/14/2017 CLINICAL DATA:  Shortness of breath, rhonchi. EXAM: CHEST  2 VIEW COMPARISON:  06/19/2014. FINDINGS: Trachea is midline. Nasogastric tube terminates in the stomach. Thoracic aorta is calcified. Heart size normal. Right apical pleuroparenchymal scarring. Linear subsegmental atelectasis in the  left lower lobe. Small bilateral pleural effusions. IMPRESSION: 1. Left lower lobe atelectasis. 2. Small bilateral pleural effusions. Electronically Signed   By: Leanna BattlesMelinda  Blietz M.D.   On: 05/14/2017 13:01    Procedures Procedures (including critical care time)  Medications Ordered in ED Medications  acetaminophen (TYLENOL) tablet 650 mg ( Oral MAR Unhold 05/11/17 1754)    Or  acetaminophen (TYLENOL) suppository 650 mg ( Rectal MAR Unhold 05/11/17 1754)  ondansetron (ZOFRAN) tablet 4 mg ( Oral MAR Unhold 05/11/17 1754)    Or  ondansetron (ZOFRAN) injection 4 mg ( Intravenous MAR Unhold 05/11/17 1754)  albuterol (PROVENTIL) (2.5 MG/3ML) 0.083% nebulizer solution 2.5 mg ( Nebulization MAR Unhold 05/11/17 1754)  pantoprazole (PROTONIX) injection 40 mg (40 mg Intravenous Given 05/13/17 2148)  MEDLINE mouth rinse (15 mLs Mouth Rinse Not Given 05/14/17 0935)  0.9 % NaCl with KCl 40 mEq / L  infusion (50 mL/hr Intravenous Rate/Dose Change 05/14/17 1301)  cefoTEtan in Dextrose 5% (CEFOTAN) 2-2.08 GM-%(50ML) IVPB (not administered)  fentaNYL (SUBLIMAZE) 100 MCG/2ML injection (not administered)  enoxaparin (LOVENOX) injection 40 mg (40 mg Subcutaneous Given 05/14/17 0934)  morphine 2 MG/ML injection 1-2 mg (1 mg Intravenous Given 05/12/17 0517)  fluvoxaMINE (LUVOX) tablet 100 mg (100 mg Oral Given 05/14/17 0935)  lamoTRIgine (LAMICTAL) tablet 100 mg (100 mg Oral Given 05/14/17 0934)  pramipexole (MIRAPEX) tablet 0.125 mg (0.125 mg Oral Given 05/13/17 2148)  fentaNYL (SUBLIMAZE) injection 50 mcg (50 mcg Intravenous Given 05/06/17 1437)  ondansetron (ZOFRAN) injection 4 mg (4 mg Intravenous Given 05/06/17 1438)  iopamidol (ISOVUE-300) 61 % injection 100 mL (100 mLs Intravenous Contrast Given 05/06/17 1515)  fentaNYL (SUBLIMAZE) injection 50 mcg (50 mcg Intravenous Given 05/06/17 2159)  fentaNYL (SUBLIMAZE) injection 50 mcg (50 mcg Intravenous Given 05/07/17 0322)  fentaNYL (SUBLIMAZE) 100  MCG/2ML injection (   Duplicate 05/07/17 0507)  diatrizoate meglumine-sodium (GASTROGRAFIN) 66-10 % solution 90 mL (90 mLs Per NG tube Given by Other 05/07/17 1546)  lidocaine-EPINEPHrine-tetracaine (LET) solution (3 mLs Topical Given 05/07/17 1243)  lidocaine (XYLOCAINE) 2 % jelly 1 application (1 application Topical Given 05/09/17 1400)  cefoTEtan (CEFOTAN) 2 g in dextrose 5 % 50 mL IVPB ( Intravenous MAR Unhold 05/11/17 1754)  methocarbamol (ROBAXIN) 500 mg in dextrose 5 % 50 mL IVPB (0 mg Intravenous Stopped 05/14/17 0522)     Initial Impression / Assessment and Plan / ED Course  I have reviewed the triage vital signs and the nursing notes.  Pertinent labs & imaging results that were available during my care of the patient were reviewed by me and considered in my medical decision making (see chart for details).  Clinical Course as of May 15 1519  Sun May 06, 2017  1718 SBO / intussusception. Expect medical mgmt.  Dr. Ezzard Standing will consult.  [AH]    Clinical Course User Index [AH] Arthor Captain, PA-C   Patient without vomiting at this time. He has a SBO/ Intussusception and Dr. Ezzard Standing will consult. Patient pain and nausea improved with medications.   Final Clinical Impressions(s) / ED Diagnoses   Final diagnoses:  SBO (small bowel obstruction) (HCC)  Encounter for imaging study to confirm nasogastric (NG) tube placement  Small bowel obstruction (HCC)  Encounter for nasogastric (NG) tube placement  Dyspnea  Rhonchi  Bibasilar crackles    ED Discharge Orders    None       Arthor Captain, PA-C 05/14/17 1523    Alvira Monday, MD 05/14/17 1910

## 2017-05-06 NOTE — ED Notes (Signed)
Bed: WA08 Expected date:  Expected time:  Means of arrival:  Comments: 76 yo abd pain

## 2017-05-06 NOTE — ED Triage Notes (Addendum)
Per EMS, patient coming from [facility], c/o abdominal pain since yesterday. Reports nausea. Denies V/D. Hx gallstones. Febrile with EMS. A&Ox4.  20g L FA 1000mg  Tylenol and 4 mg Zofran with EMS.

## 2017-05-06 NOTE — ED Notes (Signed)
First set of blood cultures sent to the lab.

## 2017-05-07 ENCOUNTER — Inpatient Hospital Stay (HOSPITAL_COMMUNITY): Payer: Medicare HMO

## 2017-05-07 ENCOUNTER — Other Ambulatory Visit: Payer: Self-pay

## 2017-05-07 LAB — COMPREHENSIVE METABOLIC PANEL
ALT: 13 U/L — ABNORMAL LOW (ref 17–63)
ANION GAP: 8 (ref 5–15)
AST: 18 U/L (ref 15–41)
Albumin: 3.9 g/dL (ref 3.5–5.0)
Alkaline Phosphatase: 95 U/L (ref 38–126)
BUN: 16 mg/dL (ref 6–20)
CHLORIDE: 105 mmol/L (ref 101–111)
CO2: 27 mmol/L (ref 22–32)
Calcium: 8.8 mg/dL — ABNORMAL LOW (ref 8.9–10.3)
Creatinine, Ser: 1.15 mg/dL (ref 0.61–1.24)
GFR, EST NON AFRICAN AMERICAN: 59 mL/min — AB (ref >60)
Glucose, Bld: 107 mg/dL — ABNORMAL HIGH (ref 65–99)
POTASSIUM: 4.1 mmol/L (ref 3.5–5.1)
Sodium: 140 mmol/L (ref 135–145)
Total Bilirubin: 1.6 mg/dL — ABNORMAL HIGH (ref 0.3–1.2)
Total Protein: 6.9 g/dL (ref 6.5–8.1)

## 2017-05-07 LAB — CBC
HCT: 42.8 % (ref 39.0–52.0)
Hemoglobin: 14 g/dL (ref 13.0–17.0)
MCH: 31.4 pg (ref 26.0–34.0)
MCHC: 32.7 g/dL (ref 30.0–36.0)
MCV: 96 fL (ref 78.0–100.0)
PLATELETS: 239 10*3/uL (ref 150–400)
RBC: 4.46 MIL/uL (ref 4.22–5.81)
RDW: 13.5 % (ref 11.5–15.5)
WBC: 10.1 10*3/uL (ref 4.0–10.5)

## 2017-05-07 MED ORDER — LIDOCAINE-EPINEPHRINE-TETRACAINE (LET) TOPICAL GEL
3.0000 mL | Freq: Once | TOPICAL | Status: DC
  Administered 2017-05-07: 3 mL via TOPICAL
  Filled 2017-05-07: qty 3

## 2017-05-07 MED ORDER — LIDOCAINE-EPINEPHRINE-TETRACAINE (LET) SOLUTION
3.0000 mL | Freq: Once | NASAL | Status: AC
  Administered 2017-05-07: 3 mL via TOPICAL
  Filled 2017-05-07: qty 3

## 2017-05-07 MED ORDER — FENTANYL CITRATE (PF) 100 MCG/2ML IJ SOLN
INTRAMUSCULAR | Status: AC
  Filled 2017-05-07: qty 2

## 2017-05-07 MED ORDER — ORAL CARE MOUTH RINSE
15.0000 mL | Freq: Two times a day (BID) | OROMUCOSAL | Status: DC
  Administered 2017-05-07 – 2017-05-16 (×9): 15 mL via OROMUCOSAL

## 2017-05-07 MED ORDER — FENTANYL CITRATE (PF) 100 MCG/2ML IJ SOLN
50.0000 ug | Freq: Once | INTRAMUSCULAR | Status: AC
  Administered 2017-05-07: 50 ug via INTRAVENOUS

## 2017-05-07 MED ORDER — DIATRIZOATE MEGLUMINE & SODIUM 66-10 % PO SOLN
90.0000 mL | Freq: Once | ORAL | Status: AC
  Administered 2017-05-07: 90 mL via NASOGASTRIC
  Filled 2017-05-07: qty 90

## 2017-05-07 NOTE — Progress Notes (Signed)
NG tube in proximal stomach. Hooked NG up to IWS. Will continue to follow small bowel protocol and monitor pt.

## 2017-05-07 NOTE — Progress Notes (Signed)
CC:  Abdominal pain  Subjective: Still having pain and no relief.  Still distended and having a little nausea, no BM or flatus  Objective: Vital signs in last 24 hours: Temp:  [99.1 F (37.3 C)-100.2 F (37.9 C)] 99.3 F (37.4 C) (12/03 0519) Pulse Rate:  [74-88] 78 (12/03 0519) Resp:  [16-25] 20 (12/03 0519) BP: (103-129)/(61-81) 129/73 (12/03 0519) SpO2:  [92 %-97 %] 97 % (12/03 0519) Weight:  [84.8 kg (187 lb)] 84.8 kg (187 lb) (12/02 1438) Last BM Date: 05/06/17 NPO 843 IV TM 100.2, VSS Labs OK K+ 4.1 Film this AM:  Persistent dilated small bowel loops are noted in the mid abdomen similar to that seen on prior CT examination. No free air is noted. Contrast is seen within the bladder from recent CT evaluation. No acute bony abnormality is noted. Degenerative changes of the lumbar spine are again seen.    Intake/Output from previous day: 12/02 0701 - 12/03 0700 In: 843.3 [I.V.:843.3] Out: -  Intake/Output this shift: No intake/output data recorded.  General appearance: alert, cooperative and no distress Resp: clear to auscultation bilaterally GI: distended, no BS, some nausea, no flatus or BM  Lab Results:  Recent Labs    05/06/17 1415 05/07/17 0505  WBC 12.0* 10.1  HGB 14.0 14.0  HCT 42.6 42.8  PLT 241 239    BMET Recent Labs    05/06/17 1415 05/07/17 0505  NA 137 140  K 4.1 4.1  CL 105 105  CO2 25 27  GLUCOSE 130* 107*  BUN 14 16  CREATININE 1.18 1.15  CALCIUM 8.9 8.8*   PT/INR No results for input(s): LABPROT, INR in the last 72 hours.  Recent Labs  Lab 05/06/17 1415 05/07/17 0505  AST 21 18  ALT 14* 13*  ALKPHOS 96 95  BILITOT 1.7* 1.6*  PROT 6.6 6.9  ALBUMIN 3.8 3.9     Lipase     Component Value Date/Time   LIPASE 27 05/06/2017 1415   Prior to Admission medications   Medication Sig Start Date End Date Taking? Authorizing Provider  acetaminophen (TYLENOL) 500 MG tablet Take 1,000 mg by mouth every 6 (six) hours as  needed for mild pain or headache.   Yes [provider]  fluvoxamine (LUVOX) 100 MG tablet Take 1 tablet (100 mg total) by mouth 2 (two) times daily. 07/01/12  Yes Etta GrandchildJones, Thomas L, MD  lamoTRIgine (LAMICTAL) 100 MG tablet Take 100 mg by mouth 2 (two) times daily.   Yes [provider]  naproxen sodium (ANAPROX) 220 MG tablet Take 220 mg by mouth 2 (two) times daily with a meal.   Yes [provider]  omeprazole (PRILOSEC) 20 MG capsule Take 20 mg by mouth daily.   Yes [provider]  pramipexole (MIRAPEX) 0.125 MG tablet Take 0.125 mg by mouth at bedtime. 03/06/17  Yes [provider]  divalproex (DEPAKOTE ER) 500 MG 24 hr tablet Take 1 tablet (500 mg total) by mouth daily. Patient not taking: Reported on 05/06/2017 07/01/12   Etta GrandchildJones, Thomas L, MD      Medications: . enoxaparin (LOVENOX) injection  40 mg Subcutaneous Q24H  . mouth rinse  15 mL Mouth Rinse BID  . pantoprazole (PROTONIX) IV  40 mg Intravenous Q24H   . sodium chloride 100 mL/hr at 05/06/17 2134   Anti-infectives (From admission, onward)   None     Assessment/Plan SBO with hx of Hernia repair x 3 Bilateral inguinal hernias - not the  source of the SBO  Bipolar disease - Lamictal/Depakote/fluvoxamine COPD Hx of bladder surgery  GERD  FEN:  IV fluids/NPO ID: None Foley:  none DVT: Lovenox Follow up:  TBD   Plan:  Insert NG then SB protocol.     LOS: 1 day    Rome Schlauch 05/07/2017 [phone]

## 2017-05-07 NOTE — Progress Notes (Signed)
NG tube placed. Called Xray to verify placement.

## 2017-05-07 NOTE — Progress Notes (Signed)
[facility]  PROGRESS NOTE  Christian Hurley [id number] DOB: [birth year] DOA: 05/06/2017 PCP: [name], Aaron, MD   Brief HPI:    76 y.o. male,.. With history of COPD, bipolar disorder was sent to ED from [facility]in [facility] for abdominal pain.  Patient says that he has had 3 days of abdominal pain and loose stools.  Last BM was around 10 AM this morning.  Patient also has been having vomiting unable to eat and drink.  He denies any blood in the vomitus.  Patient has had ventral hernia repair in the past.  CT abdomen/pelvis was done which showed partial small bowel obstruction, left upper quadrant with small intussusception and probable adhesions.  General surgery was consulted by the ED physician.    Subjective   Patient seen and examined, still no BM. Denies passing gas.   Assessment/Plan:     1. Small bowel obstruction- still no BM, no NG tube inserted. She does not vomiting. General surgery following. Continue Zofran when necessary for nausea and vomiting. 2. COPD-stable, no exacerbation. Continue when necessary albuterol 3. Bipolar disorder-by mouth meds on hold. Restart Lamictal and Luvox once small bowel obstruction resolves. 4. GERD-continue IV Protonix.    DVT prophylaxis: Lovenox  Code Status: Full code  Family Communication: No family present at bedside  Disposition Plan: Home once SBO resolves   Consultants:  General surgery  Procedures:  None  Continuous infusions . sodium chloride 100 mL/hr at 05/06/17 2134      Antibiotics:   Anti-infectives (From admission, onward)   None       Objective   Vitals:   05/06/17 1837 05/06/17 1929 05/06/17 2232 05/07/17 0519  BP: 125/69 127/61 129/61 129/73  Pulse: 88 84 86 78  Resp: (!) 23 (!) 22 (!) 22 20  Temp:  99.9 F (37.7 C) 100.2 F (37.9 C) 99.3 F (37.4 C)  TempSrc:  Oral Oral Oral  SpO2: 95% 94% 94% 97%  Weight:      Height:        Intake/Output Summary (Last 24 hours) at  05/07/2017 1347 Last data filed at 05/07/2017 1000 Gross per 24 hour  Intake 1243.33 ml  Output -  Net 1243.33 ml   Filed Weights   05/06/17 1438  Weight: 84.8 kg (187 lb)     Physical Examination:   Physical Exam: Eyes: No icterus, extraocular muscles intact  Mouth: Oral mucosa is moist, no lesions on palate,  Neck: Supple, no deformities, masses, or tenderness Lungs: Normal respiratory effort, bilateral clear to auscultation, no crackles or wheezes.  Heart: Regular rate and rhythm, S1 and S2 normal, no murmurs, rubs auscultated Abdomen: BS normoactive,soft,nondistended,non-tender to palpation,no organomegaly Extremities: No pretibial edema, no erythema, no cyanosis, no clubbing Neuro : Alert and oriented to time, place and person, No focal deficits  Skin: No rashes seen on exam     Data Reviewed: I have personally reviewed following labs and imaging studies  CBG: No results for input(s): GLUCAP in the last 168 hours.  CBC: Recent Labs  Lab 05/06/17 1415 05/07/17 0505  WBC 12.0* 10.1  NEUTROABS 10.7*  --   HGB 14.0 14.0  HCT 42.6 42.8  MCV 94.5 96.0  PLT 241 239    Basic Metabolic Panel: Recent Labs  Lab 05/06/17 1415 05/07/17 0505  NA 137 140  K 4.1 4.1  CL 105 105  CO2 25 27  GLUCOSE 130* 107*  BUN 14 16  CREATININE 1.18 1.15  CALCIUM 8.9 8.8*  Recent Results (from the past 240 hour(s))  Blood Culture (routine x 2)     Status: None (Preliminary result)   Collection Time: 05/06/17  1:46 PM  Result Value Ref Range Status   Specimen Description BLOOD BLOOD RIGHT FOREARM  Final   Special Requests   Final    BOTTLES DRAWN AEROBIC ONLY Blood Culture adequate volume   Culture   Final    NO GROWTH < 24 HOURS Performed at [facility], [address] [place], [place] [address]    Report Status PENDING  Incomplete  Blood Culture (routine x 2)     Status: None (Preliminary result)   Collection Time: 05/06/17  2:18 PM  Result Value Ref Range  Status   Specimen Description BLOOD BLOOD LEFT FOREARM  Final   Special Requests   Final    BOTTLES DRAWN AEROBIC AND ANAEROBIC Blood Culture adequate volume   Culture   Final    NO GROWTH < 24 HOURS Performed at [facility], [address] [place], [place] [address]    Report Status PENDING  Incomplete     Liver Function Tests: Recent Labs  Lab 05/06/17 1415 05/07/17 0505  AST 21 18  ALT 14* 13*  ALKPHOS 96 95  BILITOT 1.7* 1.6*  PROT 6.6 6.9  ALBUMIN 3.8 3.9   Recent Labs  Lab 05/06/17 1415  LIPASE 27   No results for input(s): AMMONIA in the last 168 hours.  Cardiac Enzymes: No results for input(s): CKTOTAL, CKMB, CKMBINDEX, TROPONINI in the last 168 hours. BNP (last 3 results) No results for input(s): BNP in the last 8760 hours.  ProBNP (last 3 results) No results for input(s): PROBNP in the last 8760 hours.    Studies: Ct Abdomen Pelvis W Contrast  Result Date: 05/06/2017 CLINICAL DATA:  Abdominal pain since yesterday, nausea. History of gallstones and recurrent hernia. EXAM: CT ABDOMEN AND PELVIS WITH CONTRAST TECHNIQUE: Multidetector CT imaging of the abdomen and pelvis was performed using the standard protocol following bolus administration of intravenous contrast. CONTRAST:  100mL ISOVUE-300 IOPAMIDOL (ISOVUE-300) INJECTION 61% COMPARISON:  CT abdomen and pelvis September 07, 2010 FINDINGS: LOWER CHEST: Lung bases are clear. Included heart size is normal. No pericardial effusion. Bilateral fat containing small diaphragm attic hernia is are unchanged. Mild bronchial wall thickening. HEPATOBILIARY: Homogeneously hypodense cysts in the liver measuring to 3.4 cm. Status post cholecystectomy. PANCREAS: Normal. SPLEEN: Normal. ADRENALS/URINARY TRACT: Kidneys are orthotopic, demonstrating symmetric enhancement. No nephrolithiasis, hydronephrosis or solid renal masses. Too small to characterize hypodensities RIGHT kidney. 1 cm cyst lower pole LEFT kidney. The  unopacified ureters are normal in course and caliber. Delayed imaging through the kidneys demonstrates symmetric prompt contrast excretion within the proximal urinary collecting system. Urinary bladder is partially distended, dome of the bladder extends toward but not into the RIGHT inguinal hernia. Normal adrenal glands. STOMACH/BOWEL: The dilated small bowel in LEFT upper quadrant measuring 2 3.7 cm with air-fluid levels and small bowel feces. Transition point LEFT upper quadrant a small intussusception. Associated mesenteric edema. Moderate descending and sigmoid colonic diverticulosis. VASCULAR/LYMPHATIC: Aortoiliac vessels are normal in course and caliber. Moderate calcific atherosclerosis. Chronic infrarenal aortic dissection. No lymphadenopathy by CT size criteria. REPRODUCTIVE: Normal. OTHER: Small volume ascites LEFT upper quadrant and pelvis. No drainable fluid collection. No intraperitoneal free air. Moderate LEFT and small RIGHT inguinal fat containing hernias with minimal free fluid. MUSCULOSKELETAL: Nonacute. Anterior abdominal wall scarring. Severe degenerative change of the lower lumbar spine. IMPRESSION: 1.  Partial small bowel obstruction LEFT upper quadrant with small intussusception, and probable adhesions. Small volume ascites. 2. Status post cholecystectomy. 3. Colonic diverticulosis without acute diverticulitis. Electronically Signed   By: Awilda Metro M.D.   On: 05/06/2017 15:53   Dg Abd Portable 1v  Result Date: 05/07/2017 CLINICAL DATA:  Follow up small bowel obstruction EXAM: PORTABLE ABDOMEN - 1 VIEW COMPARISON:  05/06/2017 FINDINGS: Persistent dilated small bowel loops are noted in the mid abdomen similar to that seen on prior CT examination. No free air is noted. Contrast is seen within the bladder from recent CT evaluation. No acute bony abnormality is noted. Degenerative changes of the lumbar spine are again seen. IMPRESSION: Relative stable appearing small-bowel obstruction  Electronically Signed   By: Alcide Clever M.D.   On: 05/07/2017 08:45    Scheduled Meds: . diatrizoate meglumine-sodium  90 mL Per NG tube Once  . enoxaparin (LOVENOX) injection  40 mg Subcutaneous Q24H  . mouth rinse  15 mL Mouth Rinse BID  . pantoprazole (PROTONIX) IV  40 mg Intravenous Q24H      Time spent: 25 min  Meredeth Ide   [facility] Pager [phone]. If 7PM-7AM, please contact night-coverage at [email or web address], Office  [phone]  password [email or web address]  05/07/2017, 1:47 PM  LOS: 1 day

## 2017-05-08 ENCOUNTER — Inpatient Hospital Stay (HOSPITAL_COMMUNITY): Payer: Medicare HMO

## 2017-05-08 LAB — BASIC METABOLIC PANEL
ANION GAP: 9 (ref 5–15)
BUN: 22 mg/dL — ABNORMAL HIGH (ref 6–20)
CO2: 26 mmol/L (ref 22–32)
Calcium: 8.8 mg/dL — ABNORMAL LOW (ref 8.9–10.3)
Chloride: 105 mmol/L (ref 101–111)
Creatinine, Ser: 1.13 mg/dL (ref 0.61–1.24)
Glucose, Bld: 94 mg/dL (ref 65–99)
POTASSIUM: 3.8 mmol/L (ref 3.5–5.1)
SODIUM: 140 mmol/L (ref 135–145)

## 2017-05-08 NOTE — Progress Notes (Signed)
[facility]  PROGRESS NOTE  Christian Hurley UJW:[id number] DOB: July 04, 1940 DOA: 05/06/2017 PCP: Farris Has, MD   Brief HPI:    76 y.o. male,.. With history of COPD, bipolar disorder was sent to ED from [facility] walk-in clinic for abdominal pain.  Patient says that he has had 3 days of abdominal pain and loose stools.  Last BM was around 10 AM this morning.  Patient also has been having vomiting unable to eat and drink.  He denies any blood in the vomitus.  Patient has had ventral hernia repair in the past.  CT abdomen/pelvis was done which showed partial small bowel obstruction, left upper quadrant with small intussusception and probable adhesions.  General surgery was consulted by the ED physician.    Subjective   Patient seen and examined, NG tube in place.   Assessment/Plan:     1. Small bowel obstruction- still no BM, NG tube inserted.  General surgery following. Continue Zofran when necessary for nausea and vomiting. 2. COPD-stable, no exacerbation. Continue when necessary albuterol 3. Bipolar disorder-by mouth meds on hold. Restart Lamictal and Luvox once small bowel obstruction resolves. 4. GERD-continue IV Protonix.    DVT prophylaxis: Lovenox  Code Status: Full code  Family Communication: No family present at bedside  Disposition Plan: Home once SBO resolves   Consultants:  General surgery  Procedures:  None  Continuous infusions . sodium chloride 100 mL/hr at 05/08/17 1031      Antibiotics:   Anti-infectives (From admission, onward)   None       Objective   Vitals:   05/07/17 1432 05/07/17 2154 05/08/17 0533 05/08/17 1426  BP: 125/70 (!) 145/75 135/73 140/70  Pulse: 80 88 91 88  Resp: 18 18 18 16   Temp: 98.6 F (37 C) 99.3 F (37.4 C) 99.2 F (37.3 C) 98.9 F (37.2 C)  TempSrc: Oral Oral Oral Oral  SpO2: 98% 93% 92% (!) 89%  Weight:      Height:        Intake/Output Summary (Last 24 hours) at 05/08/2017 1446 Last data filed  at 05/08/2017 1400 Gross per 24 hour  Intake 3160 ml  Output 2450 ml  Net 710 ml   Filed Weights   05/06/17 1438  Weight: 84.8 kg (187 lb)     Physical Examination:  Physical Exam: Eyes: No icterus, extraocular muscles intact  Mouth: Oral mucosa is moist, no lesions on palate,  Neck: Supple, no deformities, masses, or tenderness Lungs: Normal respiratory effort, bilateral clear to auscultation, no crackles or wheezes.  Heart: Regular rate and rhythm, S1 and S2 normal, no murmurs, rubs auscultated Abdomen: BS normoactive,soft,nondistended,non-tender to palpation,no organomegaly Extremities: No pretibial edema, no erythema, no cyanosis, no clubbing Neuro : Alert and oriented to time, place and person, No focal deficits Skin: No rashes seen on exam    Data Reviewed: I have personally reviewed following labs and imaging studies  CBG: No results for input(s): GLUCAP in the last 168 hours.  CBC: Recent Labs  Lab 05/06/17 1415 05/07/17 0505  WBC 12.0* 10.1  NEUTROABS 10.7*  --   HGB 14.0 14.0  HCT 42.6 42.8  MCV 94.5 96.0  PLT 241 239    Basic Metabolic Panel: Recent Labs  Lab 05/06/17 1415 05/07/17 0505 05/08/17 0506  NA 137 140 140  K 4.1 4.1 3.8  CL 105 105 105  CO2 25 27 26   GLUCOSE 130* 107* 94  BUN 14 16 22*  CREATININE 1.18 1.15 1.13  CALCIUM  8.9 8.8* 8.8*    Recent Results (from the past 240 hour(s))  Blood Culture (routine x 2)     Status: None (Preliminary result)   Collection Time: 05/06/17  1:46 PM  Result Value Ref Range Status   Specimen Description BLOOD BLOOD RIGHT FOREARM  Final   Special Requests   Final    BOTTLES DRAWN AEROBIC ONLY Blood Culture adequate volume   Culture   Final    NO GROWTH 2 DAYS Performed at [facility], [address], [place], [place] [address]    Report Status PENDING  Incomplete  Blood Culture (routine x 2)     Status: None (Preliminary result)   Collection Time: 05/06/17  2:18 PM  Result Value Ref  Range Status   Specimen Description BLOOD BLOOD LEFT FOREARM  Final   Special Requests   Final    BOTTLES DRAWN AEROBIC AND ANAEROBIC Blood Culture adequate volume   Culture   Final    NO GROWTH 2 DAYS Performed at [facility], [address], [place], [place] [address]    Report Status PENDING  Incomplete     Liver Function Tests: Recent Labs  Lab 05/06/17 1415 05/07/17 0505  AST 21 18  ALT 14* 13*  ALKPHOS 96 95  BILITOT 1.7* 1.6*  PROT 6.6 6.9  ALBUMIN 3.8 3.9   Recent Labs  Lab 05/06/17 1415  LIPASE 27   No results for input(s): AMMONIA in the last 168 hours.  Cardiac Enzymes: No results for input(s): CKTOTAL, CKMB, CKMBINDEX, TROPONINI in the last 168 hours. BNP (last 3 results) No results for input(s): BNP in the last 8760 hours.  ProBNP (last 3 results) No results for input(s): PROBNP in the last 8760 hours.    Studies: Dg Abd 1 View  Result Date: 05/08/2017 CLINICAL DATA:  Small-bowel obstruction. Abdominal pain and distention. NG tube recently pole about. EXAM: ABDOMEN - 1 VIEW COMPARISON:  05/07/2017. FINDINGS: NG tube no longer visualized. Small-bowel distention again noted. Oral contrast noted in the small bowel and colon. Stool in the colon. No free air identified. IMPRESSION: 1. NG tube is been removed. 2. Persistent small bowel distention. Oral contrast noted in the small bowel and colon. Electronically Signed   By: Maisie Fus  Register   On: 05/08/2017 11:46   Ct Abdomen Pelvis W Contrast  Result Date: 05/06/2017 CLINICAL DATA:  Abdominal pain since yesterday, nausea. History of gallstones and recurrent hernia. EXAM: CT ABDOMEN AND PELVIS WITH CONTRAST TECHNIQUE: Multidetector CT imaging of the abdomen and pelvis was performed using the standard protocol following bolus administration of intravenous contrast. CONTRAST:  ISOVUE-300 IOPAMIDOL (ISOVUE-300) INJECTION 61% COMPARISON:  CT abdomen and pelvis September 07, 2010 FINDINGS: LOWER CHEST: Lung  bases are clear. Included heart size is normal. No pericardial effusion. Bilateral fat containing small diaphragm attic hernia is are unchanged. Mild bronchial wall thickening. HEPATOBILIARY: Homogeneously hypodense cysts in the liver measuring to 3.4 cm. Status post cholecystectomy. PANCREAS: Normal. SPLEEN: Normal. ADRENALS/URINARY TRACT: Kidneys are orthotopic, demonstrating symmetric enhancement. No nephrolithiasis, hydronephrosis or solid renal masses. Too small to characterize hypodensities RIGHT kidney. 1 cm cyst lower pole LEFT kidney. The unopacified ureters are normal in course and caliber. Delayed imaging through the kidneys demonstrates symmetric prompt contrast excretion within the proximal urinary collecting system. Urinary bladder is partially distended, dome of the bladder extends toward but not into the RIGHT inguinal hernia. Normal adrenal glands. STOMACH/BOWEL: The dilated small bowel in LEFT upper quadrant measuring  2 3.7 cm with air-fluid levels and small bowel feces. Transition point LEFT upper quadrant a small intussusception. Associated mesenteric edema. Moderate descending and sigmoid colonic diverticulosis. VASCULAR/LYMPHATIC: Aortoiliac vessels are normal in course and caliber. Moderate calcific atherosclerosis. Chronic infrarenal aortic dissection. No lymphadenopathy by CT size criteria. REPRODUCTIVE: Normal. OTHER: Small volume ascites LEFT upper quadrant and pelvis. No drainable fluid collection. No intraperitoneal free air. Moderate LEFT and small RIGHT inguinal fat containing hernias with minimal free fluid. MUSCULOSKELETAL: Nonacute. Anterior abdominal wall scarring. Severe degenerative change of the lower lumbar spine. IMPRESSION: 1. Partial small bowel obstruction LEFT upper quadrant with small intussusception, and probable adhesions. Small volume ascites. 2. Status post cholecystectomy. 3. Colonic diverticulosis without acute diverticulitis. Electronically Signed   By: Awilda Metroourtnay   Bloomer M.D.   On: 05/06/2017 15:53   Dg Abd Portable 1v-small Bowel Obstruction Protocol-initial, 8 Hr Delay  Result Date: 05/08/2017 CLINICAL DATA:  This study identified missing a report at 8:34 am on 05/08/2017. 76 year old male with suspected bowel obstruction. Post 8 hour oral contrast image utilizing NG tube. EXAM: PORTABLE ABDOMEN - 1 VIEW COMPARISON:  05/07/2017 and earlier. FINDINGS: Portable AP view at 2343 hours on 05/07/2017. Enteric tube tip remains in the proximal stomach. The side hole is at the gastroesophageal junction level now. Oral contrast was administered and is present in the gastric fundus and multiple dilated small bowel loops in the left abdomen. No oral contrast visible in the right lower quadrant or proximal colon. Estimated left abdominal small bowel loops size up to 40 mm diameter. Colonic gas volume has mildly improved since 05/06/2017. Stable lung bases. Stable visualized osseous structures. IMPRESSION: 1. NG tube side hole up the level of the GEJ. Recommend advancing the tube 5 cm to ensure side hole placement in the stomach. 2. Continued small bowel obstruction. No oral contrast in the colon 8 hours following contrast administration by NG tube. 3. Dilated left abdominal small bowel loops similar to the CT on 05/06/2017. Electronically Signed   By: Odessa FlemingH  Hall M.D.   On: 05/08/2017 08:36   Dg Abd Portable 1v-small Bowel Protocol-position Verification  Result Date: 05/07/2017 CLINICAL DATA:  Nasogastric tube placement. EXAM: PORTABLE ABDOMEN - 1 VIEW COMPARISON:  Radiograph of same day. FINDINGS: Distal tip of nasogastric tube is seen in proximal stomach. Dilated small bowel loops are again noted concerning for distal small bowel obstruction. No colonic dilatation is noted. IMPRESSION: Distal tip of nasogastric tube is seen in proximal stomach. Dilated small bowel loops are noted concerning for distal small bowel obstruction. Electronically Signed   By: Lupita RaiderJames  Green Jr, M.D.   On:  05/07/2017 13:53   Dg Abd Portable 1v  Result Date: 05/07/2017 CLINICAL DATA:  Follow up small bowel obstruction EXAM: PORTABLE ABDOMEN - 1 VIEW COMPARISON:  05/06/2017 FINDINGS: Persistent dilated small bowel loops are noted in the mid abdomen similar to that seen on prior CT examination. No free air is noted. Contrast is seen within the bladder from recent CT evaluation. No acute bony abnormality is noted. Degenerative changes of the lumbar spine are again seen. IMPRESSION: Relative stable appearing small-bowel obstruction Electronically Signed   By: Alcide CleverMark  Lukens M.D.   On: 05/07/2017 08:45    Scheduled Meds: . enoxaparin (LOVENOX) injection  40 mg Subcutaneous Q24H  . mouth rinse  15 mL Mouth Rinse BID  . pantoprazole (PROTONIX) IV  40 mg Intravenous Q24H      Time spent: 25 min  Meredeth IdeGagan S Lama   [facility]  Pager [phone]. If 7PM-7AM, please contact night-coverage at [email or web address], Office  [phone]  password [email or web address]  05/08/2017, 2:46 PM  LOS: 2 days

## 2017-05-08 NOTE — Progress Notes (Signed)
    CC:  Abdominal pain  Subjective: No real change.  NG is work, but hooked up incorrectly.   AM film is pending.    Objective: Vital signs in last 24 hours: Temp:  [98.6 F (37 C)-99.3 F (37.4 C)] 99.2 F (37.3 C) (12/04 0533) Pulse Rate:  [80-91] 91 (12/04 0533) Resp:  [18] 18 (12/04 0533) BP: (125-145)/(70-75) 135/73 (12/04 0533) SpO2:  [92 %-98 %] 92 % (12/04 0533) Last BM Date: 05/05/17 2000 IV 700 urine 1700 NG TM 99.3  VSS BMP OK Film still pending, this AM.  Film last PM contrast still in stomach Intake/Output from previous day: 12/03 0701 - 12/04 0700 In: 2000 [I.V.:2000] Out: 2400 [Urine:700; Emesis/NG output:1700] Intake/Output this shift: No intake/output data recorded.  General appearance: alert, cooperative and no distress Resp: clear to auscultation bilaterally GI: a little less distended this AM, No BS, no flatus, no BM  Lab Results:  Recent Labs    05/06/17 1415 05/07/17 0505  WBC 12.0* 10.1  HGB 14.0 14.0  HCT 42.6 42.8  PLT 241 239    BMET Recent Labs    05/07/17 0505 05/08/17 0506  NA 140 140  K 4.1 3.8  CL 105 105  CO2 27 26  GLUCOSE 107* 94  BUN 16 22*  CREATININE 1.15 1.13  CALCIUM 8.8* 8.8*   PT/INR No results for input(s): LABPROT, INR in the last 72 hours.  Recent Labs  Lab 05/06/17 1415 05/07/17 0505  AST 21 18  ALT 14* 13*  ALKPHOS 96 95  BILITOT 1.7* 1.6*  PROT 6.6 6.9  ALBUMIN 3.8 3.9     Lipase     Component Value Date/Time   LIPASE 27 05/06/2017 1415     Medications: . enoxaparin (LOVENOX) injection  40 mg Subcutaneous Q24H  . mouth rinse  15 mL Mouth Rinse BID  . pantoprazole (PROTONIX) IV  40 mg Intravenous Q24H    Assessment/Plan SBO with hx of Hernia repair x 3 Bilateral inguinal hernias - not the source of the SBO  Bipolar disease - Lamictal/Depakote/fluvoxamine COPD Hx of bladder surgery  GERD  FEN:  IV fluids/NPO ID: None Foley:  none DVT: Lovenox Follow up:  TBD      Plan:  Continue NG suction and decompression.  Film this AM. 6 AM film done at 11:24 AM, some contrast in the colon, pt has lost the NG going to BR.  Will leave NG out and start of sips of clears.    LOS: 2 days    Knolan Simien 05/08/2017 [phone]

## 2017-05-08 NOTE — Progress Notes (Signed)
Will Marlyne BeardsJennings, PA aware NGT fell out. No complaints from pt. Xray called to get 0600 Xray ordered per Will. Will instructed to leave NGT out for now til xray done and reviewed.

## 2017-05-08 NOTE — Progress Notes (Signed)
Pt recently vomited upwards of 500 ml  green/brown emesis. Dr. Andrey CampanileWilson aware via phone pt denied nausea afterwards. Pt has been drinking clears since NGT out this am and had medium BM this afternoon at 1600. Md ordered to discontinue NGT orders and keep pt NPO except for ice chips. Pt reassured.

## 2017-05-09 ENCOUNTER — Inpatient Hospital Stay (HOSPITAL_COMMUNITY): Payer: Medicare HMO

## 2017-05-09 LAB — CBC
HEMATOCRIT: 42.8 % (ref 39.0–52.0)
HEMOGLOBIN: 13.6 g/dL (ref 13.0–17.0)
MCH: 30.8 pg (ref 26.0–34.0)
MCHC: 31.8 g/dL (ref 30.0–36.0)
MCV: 96.8 fL (ref 78.0–100.0)
PLATELETS: 256 10*3/uL (ref 150–400)
RBC: 4.42 MIL/uL (ref 4.22–5.81)
RDW: 13 % (ref 11.5–15.5)
WBC: 7.3 10*3/uL (ref 4.0–10.5)

## 2017-05-09 LAB — BASIC METABOLIC PANEL
ANION GAP: 7 (ref 5–15)
BUN: 20 mg/dL (ref 6–20)
CHLORIDE: 107 mmol/L (ref 101–111)
CO2: 28 mmol/L (ref 22–32)
Calcium: 8.7 mg/dL — ABNORMAL LOW (ref 8.9–10.3)
Creatinine, Ser: 1.01 mg/dL (ref 0.61–1.24)
GFR calc Af Amer: >60 mL/min (ref >60)
GLUCOSE: 92 mg/dL (ref 65–99)
POTASSIUM: 4 mmol/L (ref 3.5–5.1)
Sodium: 142 mmol/L (ref 135–145)

## 2017-05-09 LAB — MAGNESIUM: Magnesium: 2.1 mg/dL (ref 1.7–2.4)

## 2017-05-09 MED ORDER — LIDOCAINE HCL 2 % EX GEL
1.0000 "application " | Freq: Once | CUTANEOUS | Status: AC
  Administered 2017-05-09: 1 via TOPICAL
  Filled 2017-05-09: qty 5

## 2017-05-09 NOTE — Progress Notes (Addendum)
[facility]  PROGRESS NOTE  Christian Hurley:[id number] DOB: 03/29/1941 DOA: 05/06/2017 PCP: Farris Has, MD   Brief HPI:    76 y.o. male,.. With history of COPD, bipolar disorder was sent to ED from [facility] walk-in clinic for abdominal pain.  Patient says that he has had 3 days of abdominal pain and loose stools.  Last BM was around 10 AM this morning.  Patient also has been having vomiting unable to eat and drink.  He denies any blood in the vomitus.  Patient has had ventral hernia repair in the past.  CT abdomen/pelvis was done which showed partial small bowel obstruction, left upper quadrant with small intussusception and probable adhesions.  General surgery was consulted by the ED physician.    Subjective   Patient seen and examined, vomited yesterday.  NG tube replaced today   Assessment/Plan:     1. Small bowel obstruction- still no BM, NG tube re-inserted.  General surgery following. Continue Zofran when necessary for nausea and vomiting. 2. COPD-stable, no exacerbation. Continue when necessary albuterol 3. Bipolar disorder-by mouth meds on hold. Restart Lamictal and Luvox once small bowel obstruction resolves. 4. GERD-continue IV Protonix.    DVT prophylaxis: Lovenox  Code Status: Full code  Family Communication: No family present at bedside  Disposition Plan: Home once SBO resolves   Consultants:  General surgery  Procedures:  None  Continuous infusions . sodium chloride 100 mL/hr at 05/08/17 1031      Antibiotics:   Anti-infectives (From admission, onward)   None       Objective   Vitals:   05/08/17 1426 05/08/17 2130 05/09/17 0512 05/09/17 1411  BP: 140/70 139/73 130/73 (!) 148/66  Pulse: 88 85 72 80  Resp: 16 20 15 16   Temp: 98.9 F (37.2 C) 98.2 F (36.8 C) 98.1 F (36.7 C) 97.9 F (36.6 C)  TempSrc: Oral Oral Oral Oral  SpO2: (!) 89% (!) 85% 97% 96%  Weight:      Height:        Intake/Output Summary (Last 24 hours)  at 05/09/2017 1557 Last data filed at 05/09/2017 1443 Gross per 24 hour  Intake 800 ml  Output 1777 ml  Net -977 ml   Filed Weights   05/06/17 1438  Weight: 84.8 kg (187 lb)     Physical Examination:  Physical Exam: Eyes: No icterus, extraocular muscles intact  Mouth: Oral mucosa is moist, no lesions on palate,  Neck: Supple, no deformities, masses, or tenderness Lungs: Normal respiratory effort, bilateral clear to auscultation, no crackles or wheezes.  Heart: Regular rate and rhythm, S1 and S2 normal, no murmurs, rubs auscultated Abdomen: BS normoactive,soft,nondistended,non-tender to palpation,no organomegaly Extremities: No pretibial edema, no erythema, no cyanosis, no clubbing Neuro : Alert and oriented to time, place and person, No focal deficits     Data Reviewed: I have personally reviewed following labs and imaging studies  CBG: No results for input(s): GLUCAP in the last 168 hours.  CBC: Recent Labs  Lab 05/06/17 1415 05/07/17 0505 05/09/17 0525  WBC 12.0* 10.1 7.3  NEUTROABS 10.7*  --   --   HGB 14.0 14.0 13.6  HCT 42.6 42.8 42.8  MCV 94.5 96.0 96.8  PLT 241 239 256    Basic Metabolic Panel: Recent Labs  Lab 05/06/17 1415 05/07/17 0505 05/08/17 0506 05/09/17 0525  NA 137 140 140 142  K 4.1 4.1 3.8 4.0  CL 105 105 105 107  CO2 25 27 26 28   GLUCOSE  130* 107* 94 92  BUN 14 16 22* 20  CREATININE 1.18 1.15 1.13 1.01  CALCIUM 8.9 8.8* 8.8* 8.7*  MG  --   --   --  2.1    Recent Results (from the past 240 hour(s))  Blood Culture (routine x 2)     Status: None (Preliminary result)   Collection Time: 05/06/17  1:46 PM  Result Value Ref Range Status   Specimen Description BLOOD BLOOD RIGHT FOREARM  Final   Special Requests   Final    BOTTLES DRAWN AEROBIC ONLY Blood Culture adequate volume   Culture   Final    NO GROWTH 3 DAYS Performed at [facility], [address] [place], [place] [address]    Report Status PENDING  Incomplete   Blood Culture (routine x 2)     Status: None (Preliminary result)   Collection Time: 05/06/17  2:18 PM  Result Value Ref Range Status   Specimen Description BLOOD BLOOD LEFT FOREARM  Final   Special Requests   Final    BOTTLES DRAWN AEROBIC AND ANAEROBIC Blood Culture adequate volume   Culture   Final    NO GROWTH 3 DAYS Performed at [facility], [address] [place], [place] [address]    Report Status PENDING  Incomplete     Liver Function Tests: Recent Labs  Lab 05/06/17 1415 05/07/17 0505  AST 21 18  ALT 14* 13*  ALKPHOS 96 95  BILITOT 1.7* 1.6*  PROT 6.6 6.9  ALBUMIN 3.8 3.9   Recent Labs  Lab 05/06/17 1415  LIPASE 27   No results for input(s): AMMONIA in the last 168 hours.     Studies: Dg Abd 1 View  Result Date: 05/08/2017 CLINICAL DATA:  Small-bowel obstruction. Abdominal pain and distention. NG tube recently pole about. EXAM: ABDOMEN - 1 VIEW COMPARISON:  05/07/2017. FINDINGS: NG tube no longer visualized. Small-bowel distention again noted. Oral contrast noted in the small bowel and colon. Stool in the colon. No free air identified. IMPRESSION: 1. NG tube is been removed. 2. Persistent small bowel distention. Oral contrast noted in the small bowel and colon. Electronically Signed   By: Maisie Fushomas  Register   On: 05/08/2017 11:46   Dg Abd 2 Views  Result Date: 05/09/2017 CLINICAL DATA:  Follow-up small bowel obstruction EXAM: ABDOMEN - 2 VIEW COMPARISON:  Supine abdominal radiographs of 08 May 2017 FINDINGS: There remain loops of mildly distended gas and fluid-filled small bowel. There is contrast within the colon and rectum which has migrated slightly more distally since yesterday's study. There remain loops of moderately distended gas and fluid-filled small bowel in the mid and lower abdomen. No free extraluminal gas collections are observed. There is gas and fluid within the stomach. IMPRESSION: Persistent small bowel obstruction. Mild distal  migration of colonic contrast. Electronically Signed   By: David  SwazilandJordan M.D.   On: 05/09/2017 08:34   Dg Abd Portable 1v  Result Date: 05/09/2017 CLINICAL DATA:  Nasogastric placement EXAM: PORTABLE ABDOMEN - 1 VIEW COMPARISON:  Earlier same day FINDINGS: Nasogastric tube enters the stomach in has its tip in the antrum or proximal duodenum. Contrast remains in the colon. Small bowel obstruction pattern remains evident on this supine exam. IMPRESSION: Nasogastric tube tip in the antrum or proximal duodenum. Electronically Signed   By: Paulina FusiMark  Shogry M.D.   On: 05/09/2017 15:31   Dg Abd Portable 1v-small Bowel Obstruction Protocol-initial, 8 Hr Delay  Result Date: 05/08/2017 CLINICAL  DATA:  This study identified missing a report at 8:34 am on 05/08/2017. 76 year old male with suspected bowel obstruction. Post 8 hour oral contrast image utilizing NG tube. EXAM: PORTABLE ABDOMEN - 1 VIEW COMPARISON:  05/07/2017 and earlier. FINDINGS: Portable AP view at 2343 hours on 05/07/2017. Enteric tube tip remains in the proximal stomach. The side hole is at the gastroesophageal junction level now. Oral contrast was administered and is present in the gastric fundus and multiple dilated small bowel loops in the left abdomen. No oral contrast visible in the right lower quadrant or proximal colon. Estimated left abdominal small bowel loops size up to 40 mm diameter. Colonic gas volume has mildly improved since 05/06/2017. Stable lung bases. Stable visualized osseous structures. IMPRESSION: 1. NG tube side hole up the level of the GEJ. Recommend advancing the tube 5 cm to ensure side hole placement in the stomach. 2. Continued small bowel obstruction. No oral contrast in the colon 8 hours following contrast administration by NG tube. 3. Dilated left abdominal small bowel loops similar to the CT on 05/06/2017. Electronically Signed   By: Odessa FlemingH  Hall M.D.   On: 05/08/2017 08:36    Scheduled Meds: . enoxaparin (LOVENOX) injection   40 mg Subcutaneous Q24H  . lidocaine  1 application Topical Once  . mouth rinse  15 mL Mouth Rinse BID  . pantoprazole (PROTONIX) IV  40 mg Intravenous Q24H      Time spent: 25 min  Meredeth IdeGagan S Murray Durrell   [facility] Pager [phone]. If 7PM-7AM, please contact night-coverage at [email or web address], Office  [phone]  password [email or web address]  05/09/2017, 3:57 PM  LOS: 3 days

## 2017-05-09 NOTE — Progress Notes (Signed)
    CC:  Abdominal pain  Subjective: Vomited yesterday after SBP with contrast in colon.  Still nauseated on and off, and uncomfortable.    Objective: Vital signs in last 24 hours: Temp:  [98.1 F (36.7 C)-98.9 F (37.2 C)] 98.1 F (36.7 C) (12/05 0512) Pulse Rate:  [72-88] 72 (12/05 0512) Resp:  [15-20] 15 (12/05 0512) BP: (130-140)/(70-73) 130/73 (12/05 0512) SpO2:  [85 %-97 %] 97 % (12/05 0512) Last BM Date: 05/08/17 2000 IV 360 PO 351 urine recorded yesterday NG 830- out 300 emesis Stool x 1 Afebrile  VSS Labs OK Films this AM: There remain loops of mildly distended gas and fluid-filled small bowel. There is contrast within the colon and rectum which has migrated slightly more distally since yesterday's study. There remain loops of moderately distended gas and fluid-filled small bowel in the mid and lower abdomen. No free extraluminal gas collections are observed. There is gas and fluid within the stomach.    Intake/Output from previous day: 12/04 0701 - 12/05 0700 In: 2360 [P.O.:360; I.V.:2000] Out: 1182 [Urine:351; Emesis/NG output:830; Stool:1] Intake/Output this shift: No intake/output data recorded.  General appearance: alert, cooperative and no distress Resp: clear to auscultation bilaterally GI: soft, still somewhat tender on palpation, with some ongoing nausea, that comes and goes.    Lab Results:  Recent Labs    05/07/17 0505 05/09/17 0525  WBC 10.1 7.3  HGB 14.0 13.6  HCT 42.8 42.8  PLT 239 256    BMET Recent Labs    05/08/17 0506 05/09/17 0525  NA 140 142  K 3.8 4.0  CL 105 107  CO2 26 28  GLUCOSE 94 92  BUN 22* 20  CREATININE 1.13 1.01  CALCIUM 8.8* 8.7*   PT/INR No results for input(s): LABPROT, INR in the last 72 hours.  Recent Labs  Lab 05/06/17 1415 05/07/17 0505  AST 21 18  ALT 14* 13*  ALKPHOS 96 95  BILITOT 1.7* 1.6*  PROT 6.6 6.9  ALBUMIN 3.8 3.9     Lipase     Component Value Date/Time   LIPASE 27  05/06/2017 1415     Medications: . enoxaparin (LOVENOX) injection  40 mg Subcutaneous Q24H  . mouth rinse  15 mL Mouth Rinse BID  . pantoprazole (PROTONIX) IV  40 mg Intravenous Q24H    Assessment/Plan SBO with hx of Hernia repair x 3  - Contrast in colon yesterday/SBP protocol Bilateral inguinal hernias - not the source of the SBO  Bipolar disease - Lamictal/Depakote/fluvoxamine COPD Hx of bladder surgery  GERD  FEN: IV fluids/ice chips ZO:XWRU:None Foley: none DVT: Lovenox Follow up:TBD  Plan:  He was placed on clears yesterday per protocol, but vomited later that PM, still having some nausea and tenderness despite progression of the contrast and +BM on film. We plan to reinsert the NG,  I will leave him on ice chips once he has NG back in.  He was admitted 05/06/17 with 3 days of abdominal pain and loose stools not eating.  So he has been almost a week without adequate PO intake. With 3 Ventral hernia repairs and Mesh he would be a difficult case and without mesh post op, a high likely-hood that the hernia will reoccur.   Repeat film in AM.  His wife came in while I was there and I explained what was happening.           LOS: 3 days    Melo Stauber 05/09/2017 [phone]

## 2017-05-10 ENCOUNTER — Inpatient Hospital Stay (HOSPITAL_COMMUNITY): Payer: Medicare HMO

## 2017-05-10 DIAGNOSIS — Z0189 Encounter for other specified special examinations: Secondary | ICD-10-CM

## 2017-05-10 DIAGNOSIS — Z4659 Encounter for fitting and adjustment of other gastrointestinal appliance and device: Secondary | ICD-10-CM

## 2017-05-10 LAB — BASIC METABOLIC PANEL
Anion gap: 10 (ref 5–15)
BUN: 21 mg/dL — AB (ref 6–20)
CO2: 26 mmol/L (ref 22–32)
CREATININE: 0.92 mg/dL (ref 0.61–1.24)
Calcium: 8.6 mg/dL — ABNORMAL LOW (ref 8.9–10.3)
Chloride: 106 mmol/L (ref 101–111)
GFR calc Af Amer: >60 mL/min (ref >60)
GLUCOSE: 72 mg/dL (ref 65–99)
Potassium: 3.8 mmol/L (ref 3.5–5.1)
SODIUM: 142 mmol/L (ref 135–145)

## 2017-05-10 MED ORDER — POTASSIUM CHLORIDE IN NACL 40-0.9 MEQ/L-% IV SOLN
INTRAVENOUS | Status: DC
  Administered 2017-05-10 – 2017-05-12 (×4): 75 mL/h via INTRAVENOUS
  Filled 2017-05-10 (×6): qty 1000

## 2017-05-10 NOTE — Progress Notes (Signed)
[facility]  PROGRESS NOTE  Christian Hurley ZOX:[id number] DOB: 15-Feb-1941 DOA: 05/06/2017 PCP: [name], Aaron, MD   Brief HPI:    76 y.o. male,.. With history of COPD, bipolar disorder was [facility] ED [facility] for abdominal pain.  Patient says that he has had 3 days of abdominal pain and loose stools.  Last BM was around 10 AM this morning.  Patient also has been having vomiting unable to eat and drink.  He denies any blood in the vomitus.  Patient has had ventral hernia repair in the past.  CT abdomen/pelvis was done which showed partial small bowel obstruction, left upper quadrant with small intussusception and probable adhesions.  General surgery was consulted by the ED physician.    Subjective   Patient seen and examined, vomited yesterday.  NG tube replaced 12/5, continues to have significant outptut , no flatus , encouraged him to walk    Assessment/Plan:     1. Small bowel obstruction- still no BM, despite contrast in the colon after small bowel protocol, x-ray shows small bowel obstruction. NG tube re-inserted.  General surgery following. Continue Zofran when necessary for nausea and vomiting. Keep potassium greater than 4.0, magnesium greater than 2.0 2. COPD-stable, no exacerbation. Continue when necessary albuterol 3. Bipolar disorder-by mouth meds on hold. Restart Lamictal and Luvox once small bowel obstruction resolves. 4. GERD-continue IV Protonix.    DVT prophylaxis: Lovenox  Code Status: Full code  Family Communication: No family present at bedside  Disposition Plan: Home once SBO resolves   Consultants:  General surgery  Procedures:  None  Continuous infusions . sodium chloride 100 mL/hr at 05/10/17 0602      Antibiotics:   Anti-infectives (From admission, onward)   None       Objective   Vitals:   05/09/17 1411 05/09/17 2139 05/10/17 0515 05/10/17 0952  BP: (!) 148/66 (!) 153/62 (!) 141/77 135/78  Pulse: 80 71 90  78  Resp: 16 18 18 18   Temp: 97.9 F (36.6 C) 98.8 F (37.1 C) 97.6 F (36.4 C) 98.4 F (36.9 C)  TempSrc: Oral Oral Oral Axillary  SpO2: 96% 93% 97% 98%  Weight:      Height:        Intake/Output Summary (Last 24 hours) at 05/10/2017 1232 Last data filed at 05/10/2017 1209 Gross per 24 hour  Intake 2060 ml  Output 2850 ml  Net -790 ml   Filed Weights   05/06/17 1438  Weight: 84.8 kg (187 lb)     Physical Examination:  Physical Exam: Eyes: No icterus, extraocular muscles intact  Mouth: Oral mucosa is moist, no lesions on palate,  Neck: Supple, no deformities, masses, or tenderness Lungs: Normal respiratory effort, bilateral clear to auscultation, no crackles or wheezes.  Heart: Regular rate and rhythm, S1 and S2 normal, no murmurs, rubs auscultated Abdomen: BS normoactive,soft,nondistended,non-tender to palpation,no organomegaly Extremities: No pretibial edema, no erythema, no cyanosis, no clubbing Neuro : Alert and oriented to time, place and person, No focal deficits     Data Reviewed: I have personally reviewed following labs and imaging studies  CBG: No results for input(s): GLUCAP in the last 168 hours.  CBC: Recent Labs  Lab 05/06/17 1415 05/07/17 0505 05/09/17 0525  WBC 12.0* 10.1 7.3  NEUTROABS 10.7*  --   --   HGB 14.0 14.0 13.6  HCT 42.6 42.8 42.8  MCV 94.5 96.0 96.8  PLT 241 239 256    Basic Metabolic Panel: Recent  Labs  Lab 05/06/17 1415 05/07/17 0505 05/08/17 0506 05/09/17 0525 05/10/17 0513  NA 137 140 140 142 142  K 4.1 4.1 3.8 4.0 3.8  CL 105 105 105 107 106  CO2 25 27 26 28 26   GLUCOSE 130* 107* 94 92 72  BUN 14 16 22* 20 21*  CREATININE 1.18 1.15 1.13 1.01 0.92  CALCIUM 8.9 8.8* 8.8* 8.7* 8.6*  MG  --   --   --  2.1  --     Recent Results (from the past 240 hour(s))  Blood Culture (routine x 2)     Status: None (Preliminary result)   Collection Time: 05/06/17  1:46 PM  Result Value Ref Range Status   Specimen  Description BLOOD BLOOD RIGHT FOREARM  Final   Special Requests   Final    BOTTLES DRAWN AEROBIC ONLY Blood Culture adequate volume   Culture   Final    NO GROWTH 4 DAYS Performed at [facility], [address] [place], [place] [address]    Report Status PENDING  Incomplete  Blood Culture (routine x 2)     Status: None (Preliminary result)   Collection Time: 05/06/17  2:18 PM  Result Value Ref Range Status   Specimen Description BLOOD BLOOD LEFT FOREARM  Final   Special Requests   Final    BOTTLES DRAWN AEROBIC AND ANAEROBIC Blood Culture adequate volume   Culture   Final    NO GROWTH 4 DAYS Performed at [facility], [address] [place], [place] [address]    Report Status PENDING  Incomplete     Liver Function Tests: Recent Labs  Lab 05/06/17 1415 05/07/17 0505  AST 21 18  ALT 14* 13*  ALKPHOS 96 95  BILITOT 1.7* 1.6*  PROT 6.6 6.9  ALBUMIN 3.8 3.9   Recent Labs  Lab 05/06/17 1415  LIPASE 27   No results for input(s): AMMONIA in the last 168 hours.     Studies: Dg Abd 2 Views  Result Date: 05/10/2017 CLINICAL DATA:  COPD. EXAM: ABDOMEN - 2 VIEW COMPARISON:  05/09/2017.  CT 05/06/2017. FINDINGS: NG tube noted in stable position. Persistent distended loops of small bowel air-fluid levels noted. Findings consistent with persistent partial small-bowel obstruction. Oral contrast is noted colon. Degenerative changes lumbar spine. No acute bony abnormality . IMPRESSION: 1. NG tube in stable position. 2. Persistent small-bowel distention with air-fluid levels. Oral contrast in the colon. Colon is nondistended. These findings are consistent with persistent partial small-bowel obstruction. Electronically Signed   By: Maisie Fushomas  Register   On: 05/10/2017 08:41   Dg Abd 2 Views  Result Date: 05/09/2017 CLINICAL DATA:  Follow-up small bowel obstruction EXAM: ABDOMEN - 2 VIEW COMPARISON:  Supine abdominal radiographs of 08 May 2017 FINDINGS: There remain loops  of mildly distended gas and fluid-filled small bowel. There is contrast within the colon and rectum which has migrated slightly more distally since yesterday's study. There remain loops of moderately distended gas and fluid-filled small bowel in the mid and lower abdomen. No free extraluminal gas collections are observed. There is gas and fluid within the stomach. IMPRESSION: Persistent small bowel obstruction. Mild distal migration of colonic contrast. Electronically Signed   By: David  [place]azil[place] M.D.   On: 05/09/2017 08:34   Dg Abd Portable 1v  Result Date: 05/09/2017 CLINICAL DATA:  Nasogastric placement EXAM: PORTABLE ABDOMEN - 1 VIEW COMPARISON:  Earlier same day FINDINGS: Nasogastric tube enters the stomach in has its  tip in the antrum or proximal duodenum. Contrast remains in the colon. Small bowel obstruction pattern remains evident on this supine exam. IMPRESSION: Nasogastric tube tip in the antrum or proximal duodenum. Electronically Signed   By: Paulina Fusi M.D.   On: 05/09/2017 15:31    Scheduled Meds: . enoxaparin (LOVENOX) injection  40 mg Subcutaneous Q24H  . mouth rinse  15 mL Mouth Rinse BID  . pantoprazole (PROTONIX) IV  40 mg Intravenous Q24H      Time spent: 25 min  Neil Errickson   [facility] Pager [phone]. If 7PM-7AM, please contact night-coverage at [email or web address], Office  [phone]  password [email or web address]  05/10/2017, 12:32 PM  LOS: 4 days

## 2017-05-10 NOTE — Care Management Important Message (Signed)
Important Message  Patient Details  Name: Christian Hurley MRN: [id number] Date of Birth: [birth year]   Medicare Important Message Given:  Yes    Caren MacadamFuller, Irasema Chalk 05/10/2017, 10:15 AMImportant Message  Patient Details  Name: Christian Hurley MRN: [id number] Date of Birth: [birth year]   Medicare Important Message Given:  Yes    Caren MacadamFuller, Kadden Osterhout 05/10/2017, 10:15 AM

## 2017-05-10 NOTE — Progress Notes (Signed)
    DZ:[id number]:abdominal pain  Subjective: Pain seems a bit better today.  He still having abdominal discomfort.  He has a lot of NG drainage.  Has not been up walking much.  Objective: Vital signs in last 24 hours: Temp:  [97.6 F (36.4 C)-98.8 F (37.1 C)] 98.4 F (36.9 C) (12/06 0952) Pulse Rate:  [71-90] 78 (12/06 0952) Resp:  [16-18] 18 (12/06 0952) BP: (135-153)/(62-78) 135/78 (12/06 0952) SpO2:  [93 %-98 %] 98 % (12/06 0952) Last BM Date: 05/08/17 60 p.o. 2680 925 urine 1050 emesis.  I fixed the sump and he is at 450 out this a.m. Labs are stable Afebrile vital signs are stable Film this a.m.:NG tube noted in stable position. Persistent distended loops of small bowel air-fluid levels noted. Findings consistent with persistent partial small-bowel obstruction. Oral contrast is noted colon. Degenerative changes lumbar spine. No acute bony abnormality .  Intake/Output from previous day: 12/05 0701 - 12/06 0700 In: 2660 [P.O.:60; I.V.:2600] Out: 1975 [Urine:925; Emesis/NG output:1050] Intake/Output this shift: Total I/O In: 0  Out: 450 [Emesis/NG output:450]  General appearance: alert, cooperative and no distress Resp: clear to auscultation bilaterally GI: soft, still tender No BS, NG working   Lab Results:  Recent Labs    05/09/17 0525  WBC 7.3  HGB 13.6  HCT 42.8  PLT 256    BMET Recent Labs    05/09/17 0525 05/10/17 0513  NA 142 142  K 4.0 3.8  CL 107 106  CO2 28 26  GLUCOSE 92 72  BUN 20 21*  CREATININE 1.01 0.92  CALCIUM 8.7* 8.6*   PT/INR No results for input(s): LABPROT, INR in the last 72 hours.  Recent Labs  Lab 05/06/17 1415 05/07/17 0505  AST 21 18  ALT 14* 13*  ALKPHOS 96 95  BILITOT 1.7* 1.6*  PROT 6.6 6.9  ALBUMIN 3.8 3.9     Lipase     Component Value Date/Time   LIPASE 27 05/06/2017 1415     Medications: . enoxaparin (LOVENOX) injection  40 mg Subcutaneous Q24H  . mouth rinse  15 mL Mouth Rinse BID  . pantoprazole  (PROTONIX) IV  40 mg Intravenous Q24H    Assessment/Plan SBO with hx of Hernia repair x 3  - Contrast in colon yesterday/SBP protocol Bilateral inguinal hernias - not the source of the SBO  Bipolar disease - Lamictal/Depakote/fluvoxamine COPD Hx of bladder surgery  GERD  FEN: IV fluids/ice chips TM:HDQQ:None Foley: none DVT: Lovenox Follow up:TBD  Plan: Despite contrast in the colon after small bowel protocol.,  Despite reinsertion of NG tube with good drainage he still has a persisting small bowel obstruction.  I pulled his NG tube back small distance to help remove the kink seen on x-ray this morning.  Continue NG suction.  I will recheck his labs in the a.m.  And repeat his film.        LOS: 4 days    Jazelle Achey 05/10/2017 [phone]

## 2017-05-11 ENCOUNTER — Inpatient Hospital Stay (HOSPITAL_COMMUNITY): Payer: Medicare HMO | Admitting: Anesthesiology

## 2017-05-11 ENCOUNTER — Encounter (HOSPITAL_COMMUNITY): Payer: Self-pay | Admitting: Certified Registered Nurse Anesthetist

## 2017-05-11 ENCOUNTER — Inpatient Hospital Stay (HOSPITAL_COMMUNITY): Payer: Medicare HMO

## 2017-05-11 ENCOUNTER — Encounter (HOSPITAL_COMMUNITY)
Admission: EM | Disposition: A | Discharge status: Home / Self Care | Payer: Self-pay | Source: Home / Self Care | Attending: Internal Medicine

## 2017-05-11 HISTORY — PX: LAPAROTOMY: SHX154

## 2017-05-11 LAB — BASIC METABOLIC PANEL
Anion gap: 13 (ref 5–15)
BUN: 19 mg/dL (ref 6–20)
CHLORIDE: 108 mmol/L (ref 101–111)
CO2: 21 mmol/L — ABNORMAL LOW (ref 22–32)
Calcium: 8.5 mg/dL — ABNORMAL LOW (ref 8.9–10.3)
Creatinine, Ser: 0.76 mg/dL (ref 0.61–1.24)
GFR calc Af Amer: >60 mL/min (ref >60)
GFR calc non Af Amer: >60 mL/min (ref >60)
Glucose, Bld: 73 mg/dL (ref 65–99)
POTASSIUM: 4 mmol/L (ref 3.5–5.1)
SODIUM: 142 mmol/L (ref 135–145)

## 2017-05-11 LAB — CULTURE, BLOOD (ROUTINE X 2)
CULTURE: NO GROWTH
Culture: NO GROWTH
Special Requests: ADEQUATE
Special Requests: ADEQUATE

## 2017-05-11 LAB — SAMPLE TO BLOOD BANK

## 2017-05-11 LAB — CBC
HEMATOCRIT: 41.6 % (ref 39.0–52.0)
HEMOGLOBIN: 14 g/dL (ref 13.0–17.0)
MCH: 31.1 pg (ref 26.0–34.0)
MCHC: 33.7 g/dL (ref 30.0–36.0)
MCV: 92.4 fL (ref 78.0–100.0)
Platelets: 226 10*3/uL (ref 150–400)
RBC: 4.5 MIL/uL (ref 4.22–5.81)
RDW: 12.8 % (ref 11.5–15.5)
WBC: 7.4 10*3/uL (ref 4.0–10.5)

## 2017-05-11 SURGERY — LAPAROTOMY, EXPLORATORY
Anesthesia: General

## 2017-05-11 MED ORDER — LAMOTRIGINE 100 MG PO TABS
100.0000 mg | ORAL_TABLET | Freq: Two times a day (BID) | ORAL | Status: DC
  Administered 2017-05-11 – 2017-05-17 (×12): 100 mg via ORAL
  Filled 2017-05-11 (×12): qty 1

## 2017-05-11 MED ORDER — FENTANYL CITRATE (PF) 100 MCG/2ML IJ SOLN
INTRAMUSCULAR | Status: DC | PRN
  Administered 2017-05-11 (×5): 50 ug via INTRAVENOUS

## 2017-05-11 MED ORDER — SUCCINYLCHOLINE CHLORIDE 20 MG/ML IJ SOLN
INTRAMUSCULAR | Status: DC | PRN
  Administered 2017-05-11: 100 mg via INTRAVENOUS

## 2017-05-11 MED ORDER — ONDANSETRON HCL 4 MG/2ML IJ SOLN
INTRAMUSCULAR | Status: DC | PRN
  Administered 2017-05-11: 4 mg via INTRAVENOUS

## 2017-05-11 MED ORDER — CEFOTETAN DISODIUM-DEXTROSE 2-2.08 GM-%(50ML) IV SOLR
INTRAVENOUS | Status: AC
  Filled 2017-05-11: qty 50

## 2017-05-11 MED ORDER — PHENYLEPHRINE 40 MCG/ML (10ML) SYRINGE FOR IV PUSH (FOR BLOOD PRESSURE SUPPORT)
PREFILLED_SYRINGE | INTRAVENOUS | Status: DC | PRN
  Administered 2017-05-11: 80 ug via INTRAVENOUS

## 2017-05-11 MED ORDER — FENTANYL CITRATE (PF) 250 MCG/5ML IJ SOLN
INTRAMUSCULAR | Status: AC
  Filled 2017-05-11: qty 5

## 2017-05-11 MED ORDER — METOCLOPRAMIDE HCL 5 MG/ML IJ SOLN: 10.0000 mg | Freq: Once | INTRAMUSCULAR | Status: DC | PRN

## 2017-05-11 MED ORDER — PROPOFOL 10 MG/ML IV BOLUS
INTRAVENOUS | Status: AC
  Filled 2017-05-11: qty 20

## 2017-05-11 MED ORDER — ENOXAPARIN SODIUM 40 MG/0.4ML ~~LOC~~ SOLN
40.0000 mg | SUBCUTANEOUS | Status: DC
  Administered 2017-05-13 – 2017-05-17 (×5): 40 mg via SUBCUTANEOUS
  Filled 2017-05-11 (×5): qty 0.4

## 2017-05-11 MED ORDER — MEPERIDINE HCL 50 MG/ML IJ SOLN: 6.2500 mg | INTRAMUSCULAR | Status: DC | PRN

## 2017-05-11 MED ORDER — LACTATED RINGERS IV SOLN
INTRAVENOUS | Status: DC
  Administered 2017-05-11 (×2): via INTRAVENOUS

## 2017-05-11 MED ORDER — FLUVOXAMINE MALEATE 50 MG PO TABS
100.0000 mg | ORAL_TABLET | Freq: Two times a day (BID) | ORAL | Status: DC
Start: 2017-05-11 — End: 2017-05-17
  Administered 2017-05-11 – 2017-05-17 (×12): 100 mg via ORAL
  Filled 2017-05-11 (×12): qty 2

## 2017-05-11 MED ORDER — SODIUM CHLORIDE 0.9 % IR SOLN
Status: DC | PRN
  Administered 2017-05-11: 4000 mL

## 2017-05-11 MED ORDER — SUGAMMADEX SODIUM 200 MG/2ML IV SOLN
INTRAVENOUS | Status: DC | PRN
  Administered 2017-05-11: 200 mg via INTRAVENOUS

## 2017-05-11 MED ORDER — FENTANYL CITRATE (PF) 100 MCG/2ML IJ SOLN
25.0000 ug | INTRAMUSCULAR | Status: DC | PRN
  Administered 2017-05-11 (×3): 50 ug via INTRAVENOUS

## 2017-05-11 MED ORDER — FENTANYL CITRATE (PF) 100 MCG/2ML IJ SOLN
INTRAMUSCULAR | Status: AC
  Filled 2017-05-11: qty 4

## 2017-05-11 MED ORDER — PRAMIPEXOLE DIHYDROCHLORIDE 0.125 MG PO TABS
0.1250 mg | ORAL_TABLET | Freq: Every day | ORAL | Status: DC
  Administered 2017-05-11 – 2017-05-16 (×6): 0.125 mg via ORAL
  Filled 2017-05-11 (×6): qty 1

## 2017-05-11 MED ORDER — ROCURONIUM BROMIDE 10 MG/ML (PF) SYRINGE
PREFILLED_SYRINGE | INTRAVENOUS | Status: DC | PRN
  Administered 2017-05-11: 20 mg via INTRAVENOUS
  Administered 2017-05-11: 10 mg via INTRAVENOUS
  Administered 2017-05-11: 40 mg via INTRAVENOUS

## 2017-05-11 MED ORDER — LIDOCAINE 2% (20 MG/ML) 5 ML SYRINGE
INTRAMUSCULAR | Status: DC | PRN
  Administered 2017-05-11: 60 mg via INTRAVENOUS

## 2017-05-11 MED ORDER — MORPHINE SULFATE (PF) 2 MG/ML IV SOLN
1.0000 mg | INTRAVENOUS | Status: DC | PRN
  Administered 2017-05-11 – 2017-05-12 (×3): 1 mg via INTRAVENOUS
  Filled 2017-05-11 (×3): qty 1

## 2017-05-11 MED ORDER — DEXTROSE 5 % IV SOLN
2.0000 g | INTRAVENOUS | Status: AC
  Administered 2017-05-11: 2 g via INTRAVENOUS
  Filled 2017-05-11: qty 2

## 2017-05-11 MED ORDER — PROPOFOL 10 MG/ML IV BOLUS
INTRAVENOUS | Status: DC | PRN
  Administered 2017-05-11: 120 mg via INTRAVENOUS

## 2017-05-11 SURGICAL SUPPLY — 38 items
BLADE EXTENDED COATED 6.5IN (ELECTRODE) IMPLANT
BLADE HEX COATED 2.75 (ELECTRODE) ×3 IMPLANT
BLADE SURG SZ10 CARB STEEL (BLADE) IMPLANT
COVER MAYO STAND STRL (DRAPES) ×3 IMPLANT
COVER SURGICAL LIGHT HANDLE (MISCELLANEOUS) ×3 IMPLANT
DRAIN CHANNEL 19F RND (DRAIN) IMPLANT
DRAPE LAPAROSCOPIC ABDOMINAL (DRAPES) ×3 IMPLANT
DRAPE SHEET LG 3/4 BI-LAMINATE (DRAPES) IMPLANT
DRAPE WARM FLUID 44X44 (DRAPE) ×3 IMPLANT
DRSG OPSITE POSTOP 4X8 (GAUZE/BANDAGES/DRESSINGS) ×2 IMPLANT
DRSG PAD ABDOMINAL 8X10 ST (GAUZE/BANDAGES/DRESSINGS) IMPLANT
ELECT REM PT RETURN 15FT ADLT (MISCELLANEOUS) ×3 IMPLANT
EVACUATOR DRAINAGE 10X20 100CC (DRAIN) IMPLANT
EVACUATOR SILICONE 100CC (DRAIN)
GAUZE SPONGE 4X4 12PLY STRL (GAUZE/BANDAGES/DRESSINGS) ×3 IMPLANT
GLOVE BIO SURGEON STRL SZ 6 (GLOVE) ×9 IMPLANT
GLOVE BIOGEL PI IND STRL 6.5 (GLOVE) ×1 IMPLANT
GLOVE BIOGEL PI IND STRL 7.0 (GLOVE) ×1 IMPLANT
GLOVE BIOGEL PI INDICATOR 6.5 (GLOVE) ×2
GLOVE BIOGEL PI INDICATOR 7.0 (GLOVE) ×2
GLOVE INDICATOR 6.5 STRL GRN (GLOVE) ×6 IMPLANT
GOWN STRL REUS W/ TWL XL LVL3 (GOWN DISPOSABLE) ×3 IMPLANT
GOWN STRL REUS W/TWL 2XL LVL3 (GOWN DISPOSABLE) ×3 IMPLANT
GOWN STRL REUS W/TWL XL LVL3 (GOWN DISPOSABLE) ×9
HANDLE SUCTION POOLE (INSTRUMENTS) ×1 IMPLANT
KIT BASIN OR (CUSTOM PROCEDURE TRAY) ×3 IMPLANT
LEGGING LITHOTOMY PAIR STRL (DRAPES) IMPLANT
NS IRRIG 1000ML POUR BTL (IV SOLUTION) ×6 IMPLANT
PACK GENERAL/GYN (CUSTOM PROCEDURE TRAY) ×3 IMPLANT
STAPLER VISISTAT 35W (STAPLE) ×3 IMPLANT
SUCTION POOLE HANDLE (INSTRUMENTS) ×3
SUT PDS AB 1 CTX 36 (SUTURE) IMPLANT
SUT PDS AB 1 TP1 96 (SUTURE) ×2 IMPLANT
SUT VIC AB 2-0 SH 18 (SUTURE) ×3 IMPLANT
SUT VIC AB 3-0 SH 18 (SUTURE) ×7 IMPLANT
TOWEL OR 17X26 10 PK STRL BLUE (TOWEL DISPOSABLE) ×6 IMPLANT
TRAY FOLEY W/METER SILVER 16FR (SET/KITS/TRAYS/PACK) ×3 IMPLANT
YANKAUER SUCT BULB TIP NO VENT (SUCTIONS) ×3 IMPLANT

## 2017-05-11 NOTE — Transfer of Care (Signed)
Immediate Anesthesia Transfer of Care Note  Patient: Christian Hurley  Procedure(s) Performed: EXPLORATORY LAPAROTOMY, LYSIS OF ADHESIONS, REMOVAL OF GALLSTONE (N/A )  Patient Location: PACU  Anesthesia Type:General  Level of Consciousness: awake, alert  and oriented  Airway & Oxygen Therapy: Patient Spontanous Breathing and Patient connected to face mask oxygen  Post-op Assessment: Report given to RN and Post -op Vital signs reviewed and stable  Post vital signs: Reviewed and stable  Last Vitals:  Vitals:   05/11/17 1259 05/11/17 1411  BP: 138/72   Pulse: 75   Resp: 17   Temp: 37 C 36.7 C  SpO2: 99%     Last Pain:  Vitals:   05/11/17 1411  TempSrc: Oral  PainSc:       Patients Stated Pain Goal: 0 (05/10/17 1339)  Complications: No apparent anesthesia complications

## 2017-05-11 NOTE — Anesthesia Postprocedure Evaluation (Signed)
Anesthesia Post Note  Patient: Abelardo Dieselarl W Musco  Procedure(s) Performed: EXPLORATORY LAPAROTOMY, LYSIS OF ADHESIONS, REMOVAL OF GALLSTONE (N/A )     Patient location during evaluation: PACU Anesthesia Type: General Level of consciousness: awake and alert Pain management: pain level controlled Vital Signs Assessment: post-procedure vital signs reviewed and stable Respiratory status: spontaneous breathing, nonlabored ventilation, respiratory function stable and patient connected to nasal cannula oxygen Cardiovascular status: blood pressure returned to baseline and stable Postop Assessment: no apparent nausea or vomiting Anesthetic complications: no    Last Vitals:  Vitals:   05/11/17 1648 05/11/17 1730  BP: (!) 153/77 (!) 148/74  Pulse: 85 86  Resp: (!) 24 (!) 21  Temp: 36.4 C 36.7 C  SpO2: 100% 97%    Last Pain:  Vitals:   05/11/17 1715  TempSrc:   PainSc: 4                  Phillips Groutarignan, Jermayne Sweeney

## 2017-05-11 NOTE — Progress Notes (Signed)
    CC: Abdominal pain  Subjective: No significant change in his symptoms or film.  Dr. Donell BeersByerly has reviewed, and tentatively plans to take him to the operating room later today.  Objective: Vital signs in last 24 hours: Temp:  [97.7 F (36.5 C)-98.4 F (36.9 C)] 98.4 F (36.9 C) (12/07 0511) Pulse Rate:  [76-85] 77 (12/07 0511) Resp:  [16-20] 20 (12/07 0511) BP: (135-149)/(73-82) 141/75 (12/07 0511) SpO2:  [97 %-98 %] 97 % (12/07 0511) Last BM Date: 05/08/17 20 p.o. recorded, but nurse reports him taking a good deal of ice during the evening she was upset because he was taking so much ice. No IV fluids were recorded 1375 urine 1250 NG recorded Afebrile VSS Labs okay  Film this a.m.:  NG tube coiled in a hiatal hernia noted. Distended loops of small bowel again noted. Oral contrast in the colon. Colon is nondistended. Findings consist with persistent partial small bowel obstruction. Degenerative changes thoracic spine with scoliosis concave left .   Intake/Output from previous day: 12/06 0701 - 12/07 0700 In: 20 [P.O.:20] Out: 2625 [Urine:1375; Emesis/NG output:1250] Intake/Output this shift: No intake/output data recorded.  General appearance: alert, cooperative and no distress Resp: clear to auscultation bilaterally GI: soft, still has pain with cough, continues to drain green fluid from the NG No flatus or BM  Lab Results:  Recent Labs    05/09/17 0525 05/11/17 0423  WBC 7.3 7.4  HGB 13.6 14.0  HCT 42.8 41.6  PLT 256 226    BMET Recent Labs    05/10/17 0513 05/11/17 0423  NA 142 142  K 3.8 4.0  CL 106 108  CO2 26 21*  GLUCOSE 72 73  BUN 21* 19  CREATININE 0.92 0.76  CALCIUM 8.6* 8.5*   PT/INR No results for input(s): LABPROT, INR in the last 72 hours.  Recent Labs  Lab 05/06/17 1415 05/07/17 0505  AST 21 18  ALT 14* 13*  ALKPHOS 96 95  BILITOT 1.7* 1.6*  PROT 6.6 6.9  ALBUMIN 3.8 3.9     Lipase     Component Value Date/Time   LIPASE 27 05/06/2017 1415     Medications: . enoxaparin (LOVENOX) injection  40 mg Subcutaneous Q24H  . mouth rinse  15 mL Mouth Rinse BID  . pantoprazole (PROTONIX) IV  40 mg Intravenous Q24H   . 0.9 % NaCl with KCl 40 mEq / L 75 mL/hr (05/11/17 0103)     Assessment/Plan SBO with hx of Hernia repair x 3- Contrast in colon yesterday/SBP protocol Bilateral inguinal hernias - not the source of the SBO  Bipolar disease - Lamictal/Depakote/fluvoxamine COPD Hx of bladder surgery  GERD  FEN: IV fluids/ice chips WU:JWJX:None Foley: none DVT: Lovenox Follow up:TBD  Plan: Surgery later today.  Exploratory laparotomy for small bowel obstruction.    LOS: 5 days    Brianna Bennett 05/11/2017 [phone]

## 2017-05-11 NOTE — Anesthesia Preprocedure Evaluation (Signed)
Anesthesia Evaluation  Patient identified by MRN, date of birth, ID band Patient awake    Reviewed: Allergy & Precautions, NPO status , Patient's Chart, lab work & pertinent test results  Airway Mallampati: II  TM Distance: >3 FB Neck ROM: Full    Dental no notable dental hx. (+) Edentulous Upper, Edentulous Lower   Pulmonary COPD, former smoker,    Pulmonary exam normal breath sounds clear to auscultation       Cardiovascular + DVT  Normal cardiovascular exam Rhythm:Regular Rate:Normal     Neuro/Psych Bipolar Disorder negative neurological ROS     GI/Hepatic negative GI ROS, Neg liver ROS,   Endo/Other  negative endocrine ROS  Renal/GU negative Renal ROS  negative genitourinary   Musculoskeletal negative musculoskeletal ROS (+)   Abdominal   Peds negative pediatric ROS (+)  Hematology negative hematology ROS (+)   Anesthesia Other Findings   Reproductive/Obstetrics negative OB ROS                             Anesthesia Physical Anesthesia Plan  ASA: III  Anesthesia Plan: General   Post-op Pain Management:    Induction: Intravenous, Rapid sequence and Cricoid pressure planned  PONV Risk Score and Plan: 3 and Ondansetron and Treatment may vary due to age or medical condition  Airway Management Planned: Oral ETT  Additional Equipment:   Intra-op Plan:   Post-operative Plan: Extubation in OR  Informed Consent: I have reviewed the patients History and Physical, chart, labs and discussed the procedure including the risks, benefits and alternatives for the proposed anesthesia with the patient or authorized representative who has indicated his/her understanding and acceptance.   Dental advisory given  Plan Discussed with: CRNA  Anesthesia Plan Comments:         Anesthesia Quick Evaluation

## 2017-05-11 NOTE — Progress Notes (Signed)
[facility] PROGRESS NOTE  Christian Hurley FUX:[id number] DOB: Oct 28, 1940 DOA: 05/06/2017 PCP: [name], Aaron, MD  Brief summary   76 y.o.male,..With history of COPD, bipolar disorder was sent to ED from [facility]in [facility] for abdominal pain. Patient says that he has had 3 days of abdominal pain and loose stools. Patient also has been having vomiting unable to eat and drink. He denies any blood in the vomitus. Patient has had ventral hernia repair in the past. CT abdomen/pelvis was done which showed partial small bowel obstruction, left upper quadrant with small intussusception and probable adhesions. General surgery was consulted by the ED physician    Assessment/Plan:  1. Small bowel obstruction- still no BM, despite contrast in the colon after small bowel protocol, x-ray shows small bowel obstruction. NG tube re-inserted.  General surgery following. Scheduled for laparotomy today. Continue Zofran when necessary for nausea and vomiting. Keep potassium greater than 4.0, magnesium greater than 2.0 2. COPD-stable, no exacerbation. Continue when necessary albuterol 3. Bipolar disorder-by mouth meds on hold. Restart Lamictal and Luvox once small bowel obstruction resolves. 4. GERD-continue IV Protonix.    DVT prophylaxis: Lovenox  Code Status: Full code  Family Communication: No family present at bedside  Disposition Plan: Home once SBO resolves   Consultants:  General surgery  Procedures:  Laparotomy 12/7     Antibiotics: Anti-infectives (From admission, onward)   Start     Dose/Rate Route Frequency Ordered Stop   05/11/17 1000  cefoTEtan (CEFOTAN) 2 g in dextrose 5 % 50 mL IVPB     2 g 100 mL/hr over 30 Minutes Intravenous On call to O.R. 05/11/17 [id number] 05/12/17 0559        (indicate start date, and stop date if known)  HPI/Subjective: Patient reports feeling well. No flatus yet. Scheduled for laparotomy today   Objective: Vitals:   05/11/17 0511 05/11/17 1259  BP: (!) 141/75 138/72  Pulse: 77 75  Resp: 20 17  Temp: 98.4 F (36.9 C) 98.6 F (37 C)  SpO2: 97% 99%    Intake/Output Summary (Last 24 hours) at 05/11/2017 1347 Last data filed at 05/11/2017 1259 Gross per 24 hour  Intake 20 ml  Output 2625 ml  Net -2605 ml   Filed Weights   05/06/17 1438  Weight: 84.8 kg (187 lb)    Exam:   General:  No distress  Cardiovascular: s1,s2 rrr  Respiratory: CTA BL  Abdomen: soft, nt, nd   Musculoskeletal: no leg edema    Data Reviewed: Basic Metabolic Panel: Recent Labs  Lab 05/07/17 0505 05/08/17 0506 05/09/17 0525 05/10/17 0513 05/11/17 0423  NA 140 140 142 142 142  K 4.1 3.8 4.0 3.8 4.0  CL 105 105 107 106 108  CO2 27 26 28 26  21*  GLUCOSE 107* 94 92 72 73  BUN 16 22* 20 21* 19  CREATININE 1.15 1.13 1.01 0.92 0.76  CALCIUM 8.8* 8.8* 8.7* 8.6* 8.5*  MG  --   --  2.1  --   --    Liver Function Tests: Recent Labs  Lab 05/06/17 1415 05/07/17 0505  AST 21 18  ALT 14* 13*  ALKPHOS 96 95  BILITOT 1.7* 1.6*  PROT 6.6 6.9  ALBUMIN 3.8 3.9   Recent Labs  Lab 05/06/17 1415  LIPASE 27   No results for input(s): AMMONIA in the last 168 hours. CBC: Recent Labs  Lab 05/06/17 1415 05/07/17 0505 05/09/17 0525 05/11/17 0423  WBC 12.0* 10.1 7.3 7.4  NEUTROABS 10.7*  --   --   --  HGB 14.0 14.0 13.6 14.0  HCT 42.6 42.8 42.8 41.6  MCV 94.5 96.0 96.8 92.4  PLT 241 239 256 226   Cardiac Enzymes: No results for input(s): CKTOTAL, CKMB, CKMBINDEX, TROPONINI in the last 168 hours. BNP (last 3 results) No results for input(s): BNP in the last 8760 hours.  ProBNP (last 3 results) No results for input(s): PROBNP in the last 8760 hours.  CBG: No results for input(s): GLUCAP in the last 168 hours.  Recent Results (from the past 240 hour(s))  Blood Culture (routine x 2)     Status: None   Collection Time: 05/06/17  1:46 PM  Result Value Ref Range Status   Specimen Description BLOOD  BLOOD RIGHT FOREARM  Final   Special Requests   Final    BOTTLES DRAWN AEROBIC ONLY Blood Culture adequate volume   Culture   Final    NO GROWTH 5 DAYS Performed at [facility], [address] [place], [place] [address]    Report Status 05/11/2017 FINAL  Final  Blood Culture (routine x 2)     Status: None   Collection Time: 05/06/17  2:18 PM  Result Value Ref Range Status   Specimen Description BLOOD BLOOD LEFT FOREARM  Final   Special Requests   Final    BOTTLES DRAWN AEROBIC AND ANAEROBIC Blood Culture adequate volume   Culture   Final    NO GROWTH 5 DAYS Performed at [facility], [address] [place], [place] [address]    Report Status 05/11/2017 FINAL  Final     Studies: Dg Abd 1 View  Result Date: 05/11/2017 CLINICAL DATA:  Small-bowel obstruction. EXAM: ABDOMEN - 1 VIEW COMPARISON:  05/10/2017.  05/09/2017 . FINDINGS: NG tube coiled in a hiatal hernia noted. Distended loops of small bowel again noted. Oral contrast in the colon. Colon is nondistended. Findings consist with persistent partial small bowel obstruction. Degenerative changes thoracic spine with scoliosis concave left . IMPRESSION: 1. NG tube noted coiled in a hiatal hernia. 2. Persistent dilated loops of small bowel again noted. Oral contrast in the colon. Colon is nondistended. Findings consist with persistent small-bowel obstruction. No significant interim change. Electronically Signed   By: Maisie Fus  Register   On: 05/11/2017 09:25   Dg Abd 2 Views  Result Date: 05/10/2017 CLINICAL DATA:  COPD. EXAM: ABDOMEN - 2 VIEW COMPARISON:  05/09/2017.  CT 05/06/2017. FINDINGS: NG tube noted in stable position. Persistent distended loops of small bowel air-fluid levels noted. Findings consistent with persistent partial small-bowel obstruction. Oral contrast is noted colon. Degenerative changes lumbar spine. No acute bony abnormality . IMPRESSION: 1. NG tube in stable position. 2. Persistent small-bowel distention  with air-fluid levels. Oral contrast in the colon. Colon is nondistended. These findings are consistent with persistent partial small-bowel obstruction. Electronically Signed   By: Maisie Fus  Register   On: 05/10/2017 08:41   Dg Abd Portable 1v  Result Date: 05/09/2017 CLINICAL DATA:  Nasogastric placement EXAM: PORTABLE ABDOMEN - 1 VIEW COMPARISON:  Earlier same day FINDINGS: Nasogastric tube enters the stomach in has its tip in the antrum or proximal duodenum. Contrast remains in the colon. Small bowel obstruction pattern remains evident on this supine exam. IMPRESSION: Nasogastric tube tip in the antrum or proximal duodenum. Electronically Signed   By: Paulina Fusi M.D.   On: 05/09/2017 15:31    Scheduled Meds: . enoxaparin (LOVENOX) injection  40 mg Subcutaneous Q24H  . mouth rinse  15 mL Mouth Rinse  BID  . pantoprazole (PROTONIX) IV  40 mg Intravenous Q24H   Continuous Infusions: . 0.9 % NaCl with KCl 40 mEq / L 75 mL/hr (05/11/17 0103)  . cefoTEtan (CEFOTAN) IV      Active Problems:   Small bowel obstruction (HCC)   Encounter for imaging study to confirm nasogastric (NG) tube placement   Encounter for nasogastric (NG) tube placement    Time spent: >35 minutes     Esperanza SheetsBURIEV, Magie Ciampa N  [facility] Pager [phone]. If 7PM-7AM, please contact night-coverage at [email or web address], password [email or web address] 05/11/2017, 1:47 PM  LOS: 5 days

## 2017-05-11 NOTE — Anesthesia Procedure Notes (Signed)
Procedure Name: Intubation Performed by: Gean Maidens, CRNA Pre-anesthesia Checklist: Patient identified, Emergency Drugs available, Suction available, Patient being monitored and Timeout performed Patient Re-evaluated:Patient Re-evaluated prior to induction Oxygen Delivery Method: Circle system utilized Preoxygenation: Pre-oxygenation with 100% oxygen (RSI. NG to suction prior to intubation) Induction Type: IV induction Laryngoscope Size: Mac and 3 Grade View: Grade I Tube type: Oral Tube size: 7.5 mm Number of attempts: 2 Airway Equipment and Method: Stylet Placement Confirmation: ETT inserted through vocal cords under direct vision,  positive ETCO2,  CO2 detector and breath sounds checked- equal and bilateral Secured at: 23 cm Tube secured with: Tape Dental Injury: Teeth and Oropharynx as per pre-operative assessment

## 2017-05-11 NOTE — Op Note (Signed)
PRE-OPERATIVE DIAGNOSIS: small bowel obstruction, recurrent, presumed secondary to adhesions  POST-OPERATIVE DIAGNOSIS:  Small bowel obstruction secondary to gallstone ileus  PROCEDURE:  Procedure(s): Exploratory laparotomy, lysis of adhesions, removal of gallstone from distal jejunum with transverse closure  SURGEON:  Surgeon(s): Almond LintFaera Magin Balbi, MD  ANESTHESIA:   general  DRAINS: none   LOCAL MEDICATIONS USED:  NONE  SPECIMEN:  Source of Specimen:  gallstone for gross pathology  DISPOSITION OF SPECIMEN:  PATHOLOGY  COUNTS:  YES  DICTATION: .Dragon Dictation  PLAN OF CARE: return to floor after PACU  PATIENT DISPOSITION:  PACU - hemodynamically stable.  FINDINGS:  Dense adhesions, transition point, gallstone in distal jejunum  EBL: 200 mL  PROCEDURE:  Patient was identified in the holding area and taken to the operating room where he was placed supine on the operating room table.  General endotracheal anesthesia was induced.  A Foley catheter was placed.  His abdomen was prepped and draped in sterile fashion.  A timeout was performed according to the surgical safety checklist.  When all was correct, we continued.  An upper midline incision was made as the CT scan was concerning for finding of possible intusussception specifically in the left upper quadrant.  This was in the location of his prior incision.  The subcutaneous tissue was divided with the cautery.  The fascia and mesh were incorporated together.  This was incised sharply.  There were dense adhesions below the mesh and the fascia.  These were taken down sharply.  The peritoneal cavity was then entered.  The mesh and adherent omentum were divided the length of the incision.  The mesh was incorporated well and there was no evidence of recurrent hernia.  The small bowel was eviscerated.  The proximal bowel was quite dilated.  The bowel was folded over on itself in multiple locations consistent with multiple prior  operations.  A transition point was located in the mid ileum.  These dense adhesions were taken down with a combination of blunt and sharp dissection.  Further proximally, there was another area of small bowel that was folded up upon itself.  Some of these areas of adhesions did look tight.  These were taken down.  Upon taking down these adhesions, it was apparent that there was a gallstone in the small bowel.  This was most likely what was on the CT scan that had been concerning for intusussception.  The small bowel caliber was too tight to milk the gallstone to the cecum.  A small enterotomy was made purposefully to excise the gallstone.  The gallstone was passed off.  The defect was then closed in transverse fashion with interrupted 3-0 Vicryls.  There was some adjacent tissue from the adhesiolysis that was proximate to the closure.  This was used to oversew the suture line.  The small bowel was run from the ligament of Treitz to the terminal ileum and there was no evidence of injury to the small bowel.  There was no further sign of transition point.  The bowel was returned to the abdomen and the abdomen was irrigated.  The mesh and fascia was closed using interrupted #1 looped PDS suture.  Skin was then irrigated and closed with staples.  The patient's abdomen was then cleaned, dried, and dressed with a honeycomb dressing.  Patient was allowed to emerge from anesthesia and was taken to the recovery room in stable condition.  Needle, sponge, and instrument counts were correct x2.

## 2017-05-12 DIAGNOSIS — J449 Chronic obstructive pulmonary disease, unspecified: Secondary | ICD-10-CM

## 2017-05-12 MED ORDER — METHOCARBAMOL 1000 MG/10ML IJ SOLN
500.0000 mg | Freq: Three times a day (TID) | INTRAVENOUS | Status: AC
  Administered 2017-05-12 – 2017-05-14 (×6): 500 mg via INTRAVENOUS
  Filled 2017-05-12: qty 550
  Filled 2017-05-12: qty 5
  Filled 2017-05-12: qty 550
  Filled 2017-05-12 (×2): qty 5
  Filled 2017-05-12 (×2): qty 550
  Filled 2017-05-12: qty 5

## 2017-05-12 NOTE — Progress Notes (Signed)
[facility] PROGRESS NOTE  Christian Hurley ZOX:[id number] DOB: 1941-02-09 DOA: 05/06/2017 PCP: Christian Hurley, Aaron, MD  Brief summary   76 y.o.male,..With history of COPD, bipolar disorder was sent to ED from [facility] for abdominal pain. Patient says that he has had 3 days of abdominal pain and loose stools. Patient also has been having vomiting unable to eat and drink. He denies any blood in the vomitus. Patient has had ventral hernia repair in the past. CT abdomen/pelvis was done which showed partial small bowel obstruction, left upper quadrant with small intussusception and probable adhesions. General surgery was consulted by the ED physician    Assessment/Plan:  Small bowel obstruction- on admission:  no BM, despite contrast in the colon after small bowel protocol, x-ray shows small bowel obstruction. NG tube re-inserted.  General surgery followed and underwent exploratory laparotomy, lysis of adhesions, removal of gallstone from distal jejunum with transverse closure on 12/7. Continue Per surgery, a[ppreciate the input   COPD-stable, no exacerbation. Continue when necessary albuterol Bipolar disorder-by mouth meds on hold. Restart Lamictal and Luvox once small bowel obstruction resolves. GERD-continue IV Protonix.    DVT prophylaxis: Lovenox  Code Status: Full code  Family Communication: No family present at bedside  Disposition Plan: Home once SBO resolves   Consultants:  General surgery  Procedures:  Laparotomy 12/7     Antibiotics: Anti-infectives (From admission, onward)   Start     Dose/Rate Route Frequency Ordered Stop   05/11/17 1418  cefoTEtan in Dextrose 5% (CEFOTAN) 2-2.08 GM-%(50ML) IVPB    Comments:  Wylene SimmerShepherd, Karen   : cabinet override      05/11/17 1418 05/12/17 0229   05/11/17 1000  cefoTEtan (CEFOTAN) 2 g in dextrose 5 % 50 mL IVPB     2 g 100 mL/hr over 30 Minutes Intravenous On call to O.R. 05/11/17 0946 05/11/17  1538       (indicate start date, and stop date if known)  HPI/Subjective: Patient reports feeling well. No flatus yet. S/p  laparotomy POD1  Objective: Vitals:   05/12/17 0517 05/12/17 1100  BP: (!) 116/91 121/85  Pulse: 90 94  Resp: (!) 24 20  Temp: 98 F (36.7 C) 97.9 F (36.6 C)  SpO2: 98% 98%    Intake/Output Summary (Last 24 hours) at 05/12/2017 1242 Last data filed at 05/12/2017 1100 Gross per 24 hour  Intake 3972.5 ml  Output 2360 ml  Net 1612.5 ml   Filed Weights   05/06/17 1438  Weight: 84.8 kg (187 lb)    Exam:   General:  No distress  Cardiovascular: s1,s2 rrr  Respiratory: CTA BL  Abdomen: soft, nt, nd   Musculoskeletal: no leg edema    Data Reviewed: Basic Metabolic Panel: Recent Labs  Lab 05/07/17 0505 05/08/17 0506 05/09/17 0525 05/10/17 0513 05/11/17 0423  NA 140 140 142 142 142  K 4.1 3.8 4.0 3.8 4.0  CL 105 105 107 106 108  CO2 27 26 28 26  21*  GLUCOSE 107* 94 92 72 73  BUN 16 22* 20 21* 19  CREATININE 1.15 1.13 1.01 0.92 0.76  CALCIUM 8.8* 8.8* 8.7* 8.6* 8.5*  MG  --   --  2.1  --   --    Liver Function Tests: Recent Labs  Lab 05/06/17 1415 05/07/17 0505  AST 21 18  ALT 14* 13*  ALKPHOS 96 95  BILITOT 1.7* 1.6*  PROT 6.6 6.9  ALBUMIN 3.8 3.9   Recent Labs  Lab 05/06/17 1415  LIPASE 27   No results for input(s): AMMONIA in the last 168 hours. CBC: Recent Labs  Lab 05/06/17 1415 05/07/17 0505 05/09/17 0525 05/11/17 0423  WBC 12.0* 10.1 7.3 7.4  NEUTROABS 10.7*  --   --   --   HGB 14.0 14.0 13.6 14.0  HCT 42.6 42.8 42.8 41.6  MCV 94.5 96.0 96.8 92.4  PLT 241 239 256 226   Cardiac Enzymes: No results for input(s): CKTOTAL, CKMB, CKMBINDEX, TROPONINI in the last 168 hours. BNP (last 3 results) No results for input(s): BNP in the last 8760 hours.  ProBNP (last 3 results) No results for input(s): PROBNP in the last 8760 hours.  CBG: No results for input(s): GLUCAP in the last 168 hours.  Recent  Results (from the past 240 hour(s))  Blood Culture (routine x 2)     Status: None   Collection Time: 05/06/17  1:46 PM  Result Value Ref Range Status   Specimen Description BLOOD BLOOD RIGHT FOREARM  Final   Special Requests   Final    BOTTLES DRAWN AEROBIC ONLY Blood Culture adequate volume   Culture   Final    NO GROWTH 5 DAYS Performed at [facility], [address] [place], [place] [address]    Report Status 05/11/2017 FINAL  Final  Blood Culture (routine x 2)     Status: None   Collection Time: 05/06/17  2:18 PM  Result Value Ref Range Status   Specimen Description BLOOD BLOOD LEFT FOREARM  Final   Special Requests   Final    BOTTLES DRAWN AEROBIC AND ANAEROBIC Blood Culture adequate volume   Culture   Final    NO GROWTH 5 DAYS Performed at [facility], [address] [place], [place] [address]    Report Status 05/11/2017 FINAL  Final     Studies: Dg Abd 1 View  Result Date: 05/11/2017 CLINICAL DATA:  Small-bowel obstruction. EXAM: ABDOMEN - 1 VIEW COMPARISON:  05/10/2017.  05/09/2017 . FINDINGS: NG tube coiled in a hiatal hernia noted. Distended loops of small bowel again noted. Oral contrast in the colon. Colon is nondistended. Findings consist with persistent partial small bowel obstruction. Degenerative changes thoracic spine with scoliosis concave left . IMPRESSION: 1. NG tube noted coiled in a hiatal hernia. 2. Persistent dilated loops of small bowel again noted. Oral contrast in the colon. Colon is nondistended. Findings consist with persistent small-bowel obstruction. No significant interim change. Electronically Signed   By: Maisie Fushomas  Register   On: 05/11/2017 09:25    Scheduled Meds: . Melene Muller[START ON 05/13/2017] enoxaparin (LOVENOX) injection  40 mg Subcutaneous Q24H  . fluvoxaMINE  100 mg Oral BID  . lamoTRIgine  100 mg Oral BID  . mouth rinse  15 mL Mouth Rinse BID  . pantoprazole (PROTONIX) IV  40 mg Intravenous Q24H  . pramipexole  0.125 mg Oral QHS    Continuous Infusions: . 0.9 % NaCl with KCl 40 mEq / L 75 mL/hr (05/12/17 1047)  . methocarbamol (ROBAXIN)  IV      Active Problems:   Small bowel obstruction (HCC)   Encounter for imaging study to confirm nasogastric (NG) tube placement   Encounter for nasogastric (NG) tube placement    Time spent: >35 minutes     Esperanza SheetsBURIEV, Camilo Mander N  [facility] Pager [phone]. If 7PM-7AM, please contact night-coverage at [email or web address], password [email or web address] 05/12/2017, 12:42 PM  LOS: 6 days

## 2017-05-12 NOTE — Progress Notes (Signed)
Assessment SBO due to gallstone ileus s/p exploratory laparotomy, lysis of adhesions, removal of gallstone from distal jejunum 05/11/17 (Dr. Orpah ClintonByerly)-adequate pain control   Plan:  OOB.  Leave ng tube in. Wait for return of bowel function.   LOS: 6 days     1 Day Post-Op  Chief Complaint/Subjective: Pain control is adequate.  Objective: Vital signs in last 24 hours: Temp:  [97.5 F (36.4 C)-99.4 F (37.4 C)] 98 F (36.7 C) (12/08 0517) Pulse Rate:  [75-102] 90 (12/08 0517) Resp:  [17-24] 24 (12/08 0517) BP: (116-153)/(68-91) 116/91 (12/08 0517) SpO2:  [96 %-100 %] 98 % (12/08 0517) Last BM Date: 05/08/17  Intake/Output from previous day: 12/07 0701 - 12/08 0700 In: 3642.5 [I.V.:3642.5] Out: 2335 [Urine:1485; Emesis/NG output:750; Blood:100] Intake/Output this shift: No intake/output data recorded.  PE: General- In NAD.  Awake and alert. Abdomen-soft, dressing dry, hypoactive bowel sounds  Lab Results:  Recent Labs    05/11/17 0423  WBC 7.4  HGB 14.0  HCT 41.6  PLT 226   BMET Recent Labs    05/10/17 0513 05/11/17 0423  NA 142 142  K 3.8 4.0  CL 106 108  CO2 26 21*  GLUCOSE 72 73  BUN 21* 19  CREATININE 0.92 0.76  CALCIUM 8.6* 8.5*   PT/INR No results for input(s): LABPROT, INR in the last 72 hours. Comprehensive Metabolic Panel:    Component Value Date/Time   NA 142 05/11/2017 0423   NA 142 05/10/2017 0513   K 4.0 05/11/2017 0423   K 3.8 05/10/2017 0513   CL 108 05/11/2017 0423   CL 106 05/10/2017 0513   CO2 21 (L) 05/11/2017 0423   CO2 26 05/10/2017 0513   BUN 19 05/11/2017 0423   BUN 21 (H) 05/10/2017 0513   CREATININE 0.76 05/11/2017 0423   CREATININE 0.92 05/10/2017 0513   GLUCOSE 73 05/11/2017 0423   GLUCOSE 72 05/10/2017 0513   CALCIUM 8.5 (L) 05/11/2017 0423   CALCIUM 8.6 (L) 05/10/2017 0513   AST 18 05/07/2017 0505   AST 21 05/06/2017 1415   ALT 13 (L) 05/07/2017 0505   ALT 14 (L) 05/06/2017 1415   ALKPHOS 95 05/07/2017 0505   ALKPHOS 96 05/06/2017 1415   BILITOT 1.6 (H) 05/07/2017 0505   BILITOT 1.7 (H) 05/06/2017 1415   PROT 6.9 05/07/2017 0505   PROT 6.6 05/06/2017 1415   ALBUMIN 3.9 05/07/2017 0505   ALBUMIN 3.8 05/06/2017 1415     Studies/Results: Dg Abd 1 View  Result Date: 05/11/2017 CLINICAL DATA:  Small-bowel obstruction. EXAM: ABDOMEN - 1 VIEW COMPARISON:  05/10/2017.  05/09/2017 . FINDINGS: NG tube coiled in a hiatal hernia noted. Distended loops of small bowel again noted. Oral contrast in the colon. Colon is nondistended. Findings consist with persistent partial small bowel obstruction. Degenerative changes thoracic spine with scoliosis concave left . IMPRESSION: 1. NG tube noted coiled in a hiatal hernia. 2. Persistent dilated loops of small bowel again noted. Oral contrast in the colon. Colon is nondistended. Findings consist with persistent small-bowel obstruction. No significant interim change. Electronically Signed   By: Maisie Fushomas  Register   On: 05/11/2017 09:25    Anti-infectives: Anti-infectives (From admission, onward)   Start     Dose/Rate Route Frequency Ordered Stop   05/11/17 1418  cefoTEtan in Dextrose 5% (CEFOTAN) 2-2.08 GM-%(50ML) IVPB    Comments:  Wylene SimmerShepherd, Karen   : cabinet override      05/11/17 1418 05/12/17 0229   05/11/17 1000  cefoTEtan (CEFOTAN)  2 g in dextrose 5 % 50 mL IVPB     2 g 100 mL/hr over 30 Minutes Intravenous On call to O.R. 05/11/17 0946 05/11/17 1538       Caydn Justen 05/12/2017

## 2017-05-13 DIAGNOSIS — J449 Chronic obstructive pulmonary disease, unspecified: Secondary | ICD-10-CM

## 2017-05-13 LAB — CREATININE, SERUM: Creatinine, Ser: 0.84 mg/dL (ref 0.61–1.24)

## 2017-05-13 NOTE — Progress Notes (Signed)
[facility]  PROGRESS NOTE  Abelardo Dieselarl W Harnack ZOX:[id number] DOB: 08-22-1940 DOA: 05/06/2017   PCP: [name], Aaron, MD   Brief summary  76 y.o.male,..With history of COPD, bipolar disorder was sent to ED from [facility] walk-in clinic for abdominal pain. Patient says that he has had 3 days of abdominal pain and loose stools. Patient also has been having vomiting unable to eat and drink.  CT abdomen/pelvis was done which showed partial small bowel obstruction, left upper quadrant with small intussusception and probable adhesions. General surgery consulted.   Assessment/Plan:  Small bowel obstruction - s/p ex lap with lysis of adhesions, removal of gallstone from distal jejunum, post op day #2 - per surgery, keep NGT in and wait for return of bowel function - appreciate surgery team assistance   COPD - breath sounds diminished at bases but resp status overall stable - allow bronchodilators as needed   Bipolar disorder - by mouth meds on hold - resume lamictal and luvox once pt able to tolerate PO   GERD - continue IV Protonix.  DVT prophylaxis: Lovenox  Code Status: Full code  Family Communication: pt at bedsides   Disposition Plan: home once surgery team clears    Consultants:  General surgery  Procedures:  Laparotomy 12/7   Antibiotics: Anti-infectives (From admission, onward)   Start     Dose/Rate Route Frequency Ordered Stop   05/11/17 1418  cefoTEtan in Dextrose 5% (CEFOTAN) 2-2.08 GM-%(50ML) IVPB    Comments:  Wylene SimmerShepherd, Karen   : cabinet override      05/11/17 1418 05/12/17 0229   05/11/17 1000  cefoTEtan (CEFOTAN) 2 g in dextrose 5 % 50 mL IVPB     2 g 100 mL/hr over 30 Minutes Intravenous On call to O.R. 05/11/17 0946 05/11/17 1538      HPI/Subjective: Pt reports feeling better. Still with abd soreness.  Objective: Vitals:   05/12/17 2037 05/13/17 0602  BP: 132/70 124/73  Pulse: 90 93  Resp: 18 18  Temp: 98.4 F (36.9 C) 98.8 F  (37.1 C)  SpO2: 95% 93%    Intake/Output Summary (Last 24 hours) at 05/13/2017 1112 Last data filed at 05/13/2017 1019 Gross per 24 hour  Intake 805 ml  Output 2000 ml  Net -1195 ml   Filed Weights   05/06/17 1438  Weight: 84.8 kg (187 lb)    Physical Exam  Constitutional: Appears calm, NAD  CVS: RRR, S1/S2 +, no murmurs, no gallops, no carotid bruit.  Pulmonary: Effort and breath sounds normal, diminished breath sounds at bases  Abdominal: Soft. no distension, hypoactive bowel sounds   Data Reviewed: Basic Metabolic Panel: Recent Labs  Lab 05/07/17 0505 05/08/17 0506 05/09/17 0525 05/10/17 0513 05/11/17 0423 05/13/17 0522  NA 140 140 142 142 142  --   K 4.1 3.8 4.0 3.8 4.0  --   CL 105 105 107 106 108  --   CO2 27 26 28 26  21*  --   GLUCOSE 107* 94 92 72 73  --   BUN 16 22* 20 21* 19  --   CREATININE 1.15 1.13 1.01 0.92 0.76 0.84  CALCIUM 8.8* 8.8* 8.7* 8.6* 8.5*  --   MG  --   --  2.1  --   --   --    Liver Function Tests: Recent Labs  Lab 05/06/17 1415 05/07/17 0505  AST 21 18  ALT 14* 13*  ALKPHOS 96 95  BILITOT 1.7* 1.6*  PROT 6.6 6.9  ALBUMIN 3.8  3.9   Recent Labs  Lab 05/06/17 1415  LIPASE 27   CBC: Recent Labs  Lab 05/06/17 1415 05/07/17 0505 05/09/17 0525 05/11/17 0423  WBC 12.0* 10.1 7.3 7.4  NEUTROABS 10.7*  --   --   --   HGB 14.0 14.0 13.6 14.0  HCT 42.6 42.8 42.8 41.6  MCV 94.5 96.0 96.8 92.4  PLT 241 239 256 226    Recent Results (from the past 240 hour(s))  Blood Culture (routine x 2)     Status: None   Collection Time: 05/06/17  1:46 PM  Result Value Ref Range Status   Specimen Description BLOOD BLOOD RIGHT FOREARM  Final   Special Requests   Final    BOTTLES DRAWN AEROBIC ONLY Blood Culture adequate volume   Culture   Final    NO GROWTH 5 DAYS Performed at [facility], [address] [place], [place] [address]    Report Status 05/11/2017 FINAL  Final  Blood Culture (routine x 2)     Status: None    Collection Time: 05/06/17  2:18 PM  Result Value Ref Range Status   Specimen Description BLOOD BLOOD LEFT FOREARM  Final   Special Requests   Final    BOTTLES DRAWN AEROBIC AND ANAEROBIC Blood Culture adequate volume   Culture   Final    NO GROWTH 5 DAYS Performed at [facility], [address] [place], [place] [address]    Report Status 05/11/2017 FINAL  Final     Studies: No results found.  Scheduled Meds: . enoxaparin (LOVENOX) injection  40 mg Subcutaneous Q24H  . fluvoxaMINE  100 mg Oral BID  . lamoTRIgine  100 mg Oral BID  . mouth rinse  15 mL Mouth Rinse BID  . pantoprazole (PROTONIX) IV  40 mg Intravenous Q24H  . pramipexole  0.125 mg Oral QHS   Continuous Infusions: . 0.9 % NaCl with KCl 40 mEq / L 75 mL/hr (05/12/17 1047)  . methocarbamol (ROBAXIN)  IV 500 mg (05/13/17 0514)     Time spent: 25 minutes   [facility] Mag[facility]-En[facility]elia Martorelli  [facility] Pager [phone]. If 7PM-7AM, please contact night-coverage at [email or web address], password [email or web address] 05/13/2017, 11:12 AM  LOS: 7 days

## 2017-05-13 NOTE — Progress Notes (Signed)
Assessment SBO due to gallstone ileus s/p exploratory laparotomy, lysis of adhesions, removal of gallstone from distal jejunum 05/11/17 (Dr. Keene BreathByerly)-no bowel function yet.   Plan:  OOB.  Leave ng tube in. Wait for return of bowel function.   LOS: 7 days     2 Days Post-Op  Chief Complaint/Subjective: Up in chair.  No BM or flatus.  Objective: Vital signs in last 24 hours: Temp:  [97.9 F (36.6 C)-98.8 F (37.1 C)] 98.8 F (37.1 C) (12/09 0602) Pulse Rate:  [86-94] 93 (12/09 0602) Resp:  [18-20] 18 (12/09 0602) BP: (121-132)/(56-85) 124/73 (12/09 0602) SpO2:  [93 %-98 %] 93 % (12/09 0602) Last BM Date: 05/08/17  Intake/Output from previous day: 12/08 0701 - 12/09 0700 In: 1075 [P.O.:120; I.V.:900; IV Piggyback:55] Out: 1900 [Urine:1400; Emesis/NG output:500] Intake/Output this shift: Total I/O In: 60 [P.O.:60] Out: 700 [Urine:300; Emesis/NG output:400]  PE: General- In NAD.  Awake and alert. Abdomen-soft, dressing dry, hypoactive bowel sounds  Lab Results:  Recent Labs    05/11/17 0423  WBC 7.4  HGB 14.0  HCT 41.6  PLT 226   BMET Recent Labs    05/11/17 0423 05/13/17 0522  NA 142  --   K 4.0  --   CL 108  --   CO2 21*  --   GLUCOSE 73  --   BUN 19  --   CREATININE 0.76 0.84  CALCIUM 8.5*  --    PT/INR No results for input(s): LABPROT, INR in the last 72 hours. Comprehensive Metabolic Panel:    Component Value Date/Time   NA 142 05/11/2017 0423   NA 142 05/10/2017 0513   K 4.0 05/11/2017 0423   K 3.8 05/10/2017 0513   CL 108 05/11/2017 0423   CL 106 05/10/2017 0513   CO2 21 (L) 05/11/2017 0423   CO2 26 05/10/2017 0513   BUN 19 05/11/2017 0423   BUN 21 (H) 05/10/2017 0513   CREATININE 0.84 05/13/2017 0522   CREATININE 0.76 05/11/2017 0423   GLUCOSE 73 05/11/2017 0423   GLUCOSE 72 05/10/2017 0513   CALCIUM 8.5 (L) 05/11/2017 0423   CALCIUM 8.6 (L) 05/10/2017 0513   AST 18 05/07/2017 0505   AST 21 05/06/2017 1415   ALT 13 (L) 05/07/2017  0505   ALT 14 (L) 05/06/2017 1415   ALKPHOS 95 05/07/2017 0505   ALKPHOS 96 05/06/2017 1415   BILITOT 1.6 (H) 05/07/2017 0505   BILITOT 1.7 (H) 05/06/2017 1415   PROT 6.9 05/07/2017 0505   PROT 6.6 05/06/2017 1415   ALBUMIN 3.9 05/07/2017 0505   ALBUMIN 3.8 05/06/2017 1415     Studies/Results: No results found.  Anti-infectives: Anti-infectives (From admission, onward)   Start     Dose/Rate Route Frequency Ordered Stop   05/11/17 1418  cefoTEtan in Dextrose 5% (CEFOTAN) 2-2.08 GM-%(50ML) IVPB    Comments:  Wylene SimmerShepherd, Karen   : cabinet override      05/11/17 1418 05/12/17 0229   05/11/17 1000  cefoTEtan (CEFOTAN) 2 g in dextrose 5 % 50 mL IVPB     2 g 100 mL/hr over 30 Minutes Intravenous On call to O.R. 05/11/17 0946 05/11/17 1538       Christian Hurley 05/13/2017

## 2017-05-14 ENCOUNTER — Inpatient Hospital Stay (HOSPITAL_COMMUNITY): Payer: Medicare HMO

## 2017-05-14 DIAGNOSIS — R0989 Other specified symptoms and signs involving the circulatory and respiratory systems: Secondary | ICD-10-CM

## 2017-05-14 LAB — CBC
HEMATOCRIT: 37.4 % — AB (ref 39.0–52.0)
Hemoglobin: 12.5 g/dL — ABNORMAL LOW (ref 13.0–17.0)
MCH: 31.4 pg (ref 26.0–34.0)
MCHC: 33.4 g/dL (ref 30.0–36.0)
MCV: 94 fL (ref 78.0–100.0)
Platelets: 273 10*3/uL (ref 150–400)
RBC: 3.98 MIL/uL — AB (ref 4.22–5.81)
RDW: 13.2 % (ref 11.5–15.5)
WBC: 9.4 10*3/uL (ref 4.0–10.5)

## 2017-05-14 LAB — BASIC METABOLIC PANEL
ANION GAP: 11 (ref 5–15)
BUN: 15 mg/dL (ref 6–20)
CO2: 24 mmol/L (ref 22–32)
Calcium: 8.3 mg/dL — ABNORMAL LOW (ref 8.9–10.3)
Chloride: 107 mmol/L (ref 101–111)
Creatinine, Ser: 0.74 mg/dL (ref 0.61–1.24)
GFR calc Af Amer: >60 mL/min (ref >60)
GFR calc non Af Amer: >60 mL/min (ref >60)
GLUCOSE: 86 mg/dL (ref 65–99)
POTASSIUM: 3.8 mmol/L (ref 3.5–5.1)
Sodium: 142 mmol/L (ref 135–145)

## 2017-05-14 NOTE — Progress Notes (Signed)
3 Days Post-Op    CC: Abdominal pain  Subjective: He sounds terrible this morning very congested with some rales.  We will need to work on his pulmonary toilet.  He does have bowel sounds no flatus or BM so far.  Complains of his throat being sore.  Objective: Vital signs in last 24 hours: Temp:  [98.4 F (36.9 C)-99.1 F (37.3 C)] 98.4 F (36.9 C) (12/10 0529) Pulse Rate:  [81-88] 81 (12/10 0529) Resp:  [18] 18 (12/10 0529) BP: (128-143)/(62-68) 134/62 (12/10 0529) SpO2:  [94 %-95 %] 95 % (12/10 0529) Last BM Date: 05/08/17 PO 120 2400 IV 1650 urine 1050 NG  T-max 99.1.  Vital signs are stable BMP stable, CBC shows white counts normal hemoglobin/hematocrit down slightly. No films. Intake/Output from previous day: 12/09 0701 - 12/10 0700 In: 2590 [P.O.:120; I.V.:2250; IV Piggyback:220] Out: 2700 [Urine:1650; Emesis/NG output:1050] Intake/Output this shift: Total I/O In: 780 [P.O.:30; I.V.:750] Out: 400 [Urine:250; Emesis/NG output:150]  General appearance: alert, cooperative and no distress Resp: Bilateral rhonchi and rales at the base.  Congested cough not clearing his secretions. GI: Soft, sore, dressing in place few bowel sounds no flatus or BM.  Lab Results:  Recent Labs    05/14/17 0446  WBC 9.4  HGB 12.5*  HCT 37.4*  PLT 273    BMET Recent Labs    05/13/17 0522 05/14/17 0446  NA  --  142  K  --  3.8  CL  --  107  CO2  --  24  GLUCOSE  --  86  BUN  --  15  CREATININE 0.84 0.74  CALCIUM  --  8.3*   PT/INR No results for input(s): LABPROT, INR in the last 72 hours.  No results for input(s): AST, ALT, ALKPHOS, BILITOT, PROT, ALBUMIN in the last 168 hours.   Lipase     Component Value Date/Time   LIPASE 27 05/06/2017 1415     Medications: . enoxaparin (LOVENOX) injection  40 mg Subcutaneous Q24H  . fluvoxaMINE  100 mg Oral BID  . lamoTRIgine  100 mg Oral BID  . mouth rinse  15 mL Mouth Rinse BID  . pantoprazole (PROTONIX) IV  40 mg  Intravenous Q24H  . pramipexole  0.125 mg Oral QHS    Assessment/Plan Small bowel obstruction with gallstone ileus. History of ventral hernia repair x3 Bilateral inguinal hernias - not the source of the SBO  Status post exploratory laparotomy, lysis of adhesions, removal of gallstone from distal jejunum and transverse closure, 05/11/17, Dr. Joseph ArtFiera Byerly.  POD 3  Bipolar disease - Lamictal/Depakote/fluvoxamine COPD Hx of bladder surgery  GERD  FEN: IV fluids/ice chips ZO:XWRU:None Foley: none DVT: Lovenox Follow up:TBD  Plan: Pulmonary toilet every hour, mobilize more.  Await bowel function return.     LOS: 8 days    Greydis Stlouis 05/14/2017 [phone]

## 2017-05-14 NOTE — Evaluation (Addendum)
Physical Therapy Evaluation Patient Details Name: Abelardo Dieselarl W Fulco MRN: [id number] DOB: 06-18-1940 Today's Date: 05/14/2017   History of Present Illness  76 y.o. male  With history of COPD, bipolar disorder was sent to ED from [facility]in [facility] for abdominal pain.  Patient has had 3 days of abdominal pain and loose stool;  s/p ex lap with lysis of adhesions, removal of gallstone from distal jejunum     Clinical Impression  Pt admitted with above diagnosis. Pt currently with functional limitations due to the deficits listed below (see PT Problem List). Pt will benefit from skilled PT to increase their independence and safety with mobility to allow discharge to the venue listed below.   Pt presents with higher level balance deficits and mildly unsteady gait, experiencing LOB  With head turns (limited cervical ROM at baseline); recommend HHPT (depending on progress) as pt does most of the household tasks at home since his wife is on a walker; will follow in acute setting      Follow Up Recommendations Home health PT;No PT follow up(depending on progress)    Equipment Recommendations  None recommended by PT    Recommendations for Other Services       Precautions / Restrictions Precautions Precautions: Fall Restrictions Weight Bearing Restrictions: No  NGT     Mobility  Bed Mobility Overal bed mobility: Needs Assistance Bed Mobility: Sit to Supine       Sit to supine: Supervision      Transfers Overall transfer level: Needs assistance Equipment used: None Transfers: Sit to/from Stand Sit to Stand: Min guard         General transfer comment: min/guard to rise and stabilize;   Ambulation/Gait Ambulation/Gait assistance: Min assist;Min guard Ambulation Distance (Feet): 380 Feet Assistive device: None Gait Pattern/deviations: Narrow base of support;Drifts right/left     General Gait Details: min to min guard, pt is mildly unsteady and has LOB with head turns  needing light min assist to recover  Stairs            Wheelchair Mobility    Modified Rankin (Stroke Patients Only)       Balance Overall balance assessment: Needs assistance Sitting-balance support: No upper extremity supported;Feet supported Sitting balance-Leahy Scale: Fair       Standing balance-Leahy Scale: Fair Standing balance comment: able to maintain static stand without support, requires assist with dynamic activity                             Pertinent Vitals/Pain Pain Assessment: No/denies pain    Home Living Family/patient expects to be discharged to:: Private residence Living Arrangements: Spouse/significant other Available Help at Discharge: Family Type of Home: House       Home Layout: One level Home Equipment: None Additional Comments: wife has RW    Prior Function Level of Independence: Independent         Comments: pt  reports his wife amb with RW, he does driving and most of household tasks     Hand Dominance        Extremity/Trunk Assessment   Upper Extremity Assessment Upper Extremity Assessment: Defer to OT evaluation    Lower Extremity Assessment Lower Extremity Assessment: Generalized weakness    Cervical / Trunk Assessment Cervical / Trunk Assessment: Other exceptions;Kyphotic Cervical / Trunk Exceptions: forward head posture, limited cervical rotation  Communication   Communication: No difficulties  Cognition Arousal/Alertness: Awake/alert Behavior During Therapy: [facility]WFL for  tasks assessed/performed Overall Cognitive Status: Within Functional Limits for tasks assessed                                        General Comments      Exercises     Assessment/Plan    PT Assessment Patient needs continued PT services  PT Problem List Decreased activity tolerance;Decreased balance;Decreased mobility;Decreased knowledge of use of DME       PT Treatment Interventions Therapeutic  activities;Gait training;Functional mobility training;Therapeutic exercise;Patient/family education;Balance training    PT Goals (Current goals can be found in the Care Plan section)  Acute Rehab PT Goals Patient Stated Goal: get better PT Goal Formulation: With patient Time For Goal Achievement: 05/28/17 Potential to Achieve Goals: Good    Frequency Min 3X/week   Barriers to discharge        Co-evaluation               AM-PAC PT "6 Clicks" Daily Activity  Outcome Measure Difficulty turning over in bed (including adjusting bedclothes, sheets and blankets)?: A Little Difficulty moving from lying on back to sitting on the side of the bed? : A Little Difficulty sitting down on and standing up from a chair with arms (e.g., wheelchair, bedside commode, etc,.)?: A Little Help needed moving to and from a bed to chair (including a wheelchair)?: A Little Help needed walking in hospital room?: A Little Help needed climbing 3-5 steps with a railing? : A Little 6 Click Score: 18    End of Session   Activity Tolerance: Patient tolerated treatment well Patient left: in bed;with call bell/phone within reach;with bed alarm set   PT Visit Diagnosis: Difficulty in walking, not elsewhere classified (R26.2)    Time: [id number] PT Time Calculation (min) (ACUTE ONLY): 13 min   Charges:   PT Evaluation $PT Eval Low Complexity: 1 Low     PT G Codes:          Jaheem Hedgepath 05/14/2017, 3:10 PM

## 2017-05-14 NOTE — Progress Notes (Addendum)
[facility]  PROGRESS NOTE  Christian Hurley UJW:[id number] DOB: [birth year] DOA: 05/06/2017   PCP: Farris Has, MD   Brief summary  76 y.o.male,..With history of COPD, bipolar disorder was sent to ED from [facility] walk-in clinic for abdominal pain. Patient says that he has had 3 days of abdominal pain and loose stools. Patient also has been having vomiting unable to eat and drink.  CT abdomen/pelvis was done which showed partial small bowel obstruction, left upper quadrant with small intussusception and probable adhesions. General surgery consulted.   Assessment/Plan:  Small bowel obstruction - s/p ex lap with lysis of adhesions, removal of gallstone from distal jejunum, post op day #3 - surgery team following, follow up on recommendations  - appreciate assistance   Cough 12/10 - somewhat dry - rhonchi on exam noted - requested CXR for further evaluation  - will lower the rate on IVF to 50 cc as some vascular congestion is also possible   COPD - no wheezing, noted - allow bronchodilators as needed  - IS encouraged   Bipolar disorder - by mouth meds on hold - resume lamictal and luvox once pt able to tolerate PO   GERD - continue IV Protonix.  DVT prophylaxis: Lovenox  Code Status: Full code  Family Communication: pt at bedside   Disposition Plan: home once surgery team clears    Consultants:  General surgery  Procedures:  Laparotomy 12/7   Antibiotics: Anti-infectives (From admission, onward)   Start     Dose/Rate Route Frequency Ordered Stop   05/11/17 1418  cefoTEtan in Dextrose 5% (CEFOTAN) 2-2.08 GM-%(50ML) IVPB    Comments:  Wylene Simmer   : cabinet override      05/11/17 1418 05/12/17 0229   05/11/17 1000  cefoTEtan (CEFOTAN) 2 g in dextrose 5 % 50 mL IVPB     2 g 100 mL/hr over 30 Minutes Intravenous On call to O.R. 05/11/17 0946 05/11/17 1538     HPI/Subjective: Still with abd soreness and cough.  Objective: Vitals:   05/13/17 2220 05/14/17 0529  BP: (!) 143/67 134/62  Pulse: 83 81  Resp: 18 18  Temp: 99.1 F (37.3 C) 98.4 F (36.9 C)  SpO2: 95% 95%    Intake/Output Summary (Last 24 hours) at 05/14/2017 0829 Last data filed at 05/14/2017 0530 Gross per 24 hour  Intake 2590 ml  Output 2700 ml  Net -110 ml   Filed Weights   05/06/17 1438  Weight: 84.8 kg (187 lb)   Physical Exam  Constitutional: Appears calm, NAD CVS: RRR, S1/S2 +, no murmurs, no gallops, no carotid bruit.  Pulmonary: scattered rhonchi noted, no wheezing, diminished sounds at bases  Abdominal: Soft, no distension Musculoskeletal: Normal range of motion. No edema and no tenderness.   Data Reviewed: Basic Metabolic Panel: Recent Labs  Lab 05/08/17 0506 05/09/17 0525 05/10/17 0513 05/11/17 0423 05/13/17 0522 05/14/17 0446  NA 140 142 142 142  --  142  K 3.8 4.0 3.8 4.0  --  3.8  CL 105 107 106 108  --  107  CO2 21*  --  24  GLUCOSE 94 92 72 73  --  86  BUN 22* 20 21* 19  --  15  CREATININE 1.13 1.01 0.92 0.76 0.84 0.74  CALCIUM 8.8* 8.7* 8.6* 8.5*  --  8.3*  MG  --  2.1  --   --   --   --    CBC: Recent Labs  Lab 05/09/17  [id number] 05/11/17 0423 05/14/17 0446  WBC 7.3 7.4 9.4  HGB 13.6 14.0 12.5*  HCT 42.8 41.6 37.4*  MCV 96.8 92.4 94.0  PLT 256 226 273    Recent Results (from the past 240 hour(s))  Blood Culture (routine x 2)     Status: None   Collection Time: 05/06/17  1:46 PM  Result Value Ref Range Status   Specimen Description BLOOD BLOOD RIGHT FOREARM  Final   Special Requests   Final    BOTTLES DRAWN AEROBIC ONLY Blood Culture adequate volume   Culture   Final    NO GROWTH 5 DAYS Performed at [facility], [address] [place], [place] [address]    Report Status 05/11/2017 FINAL  Final  Blood Culture (routine x 2)     Status: None   Collection Time: 05/06/17  2:18 PM  Result Value Ref Range Status   Specimen Description BLOOD BLOOD LEFT FOREARM  Final   Special Requests    Final    BOTTLES DRAWN AEROBIC AND ANAEROBIC Blood Culture adequate volume   Culture   Final    NO GROWTH 5 DAYS Performed at [facility], [address] [place], [place] [address]    Report Status 05/11/2017 FINAL  Final     Studies: No results found.  Scheduled Meds: . enoxaparin (LOVENOX) injection  40 mg Subcutaneous Q24H  . fluvoxaMINE  100 mg Oral BID  . lamoTRIgine  100 mg Oral BID  . mouth rinse  15 mL Mouth Rinse BID  . pantoprazole (PROTONIX) IV  40 mg Intravenous Q24H  . pramipexole  0.125 mg Oral QHS   Continuous Infusions: . 0.9 % NaCl with KCl 40 mEq / L 75 mL/hr (05/12/17 1047)    Time spent: 25 minutes   [facility] Mag[facility]ers  [facility] Pager [phone]. If 7PM-7AM, please contact night-coverage at [email or web address], password [email or web address] 05/14/2017, 8:29 AM  LOS: 8 days

## 2017-05-15 DIAGNOSIS — Z0189 Encounter for other specified special examinations: Secondary | ICD-10-CM

## 2017-05-15 LAB — CBC
HCT: 38.1 % — ABNORMAL LOW (ref 39.0–52.0)
Hemoglobin: 12.8 g/dL — ABNORMAL LOW (ref 13.0–17.0)
MCH: 31.5 pg (ref 26.0–34.0)
MCHC: 33.6 g/dL (ref 30.0–36.0)
MCV: 93.8 fL (ref 78.0–100.0)
PLATELETS: 304 10*3/uL (ref 150–400)
RBC: 4.06 MIL/uL — ABNORMAL LOW (ref 4.22–5.81)
RDW: 13.2 % (ref 11.5–15.5)
WBC: 9 10*3/uL (ref 4.0–10.5)

## 2017-05-15 LAB — BASIC METABOLIC PANEL
ANION GAP: 7 (ref 5–15)
BUN: 16 mg/dL (ref 6–20)
CALCIUM: 8.4 mg/dL — AB (ref 8.9–10.3)
CO2: 26 mmol/L (ref 22–32)
Chloride: 107 mmol/L (ref 101–111)
Creatinine, Ser: 0.74 mg/dL (ref 0.61–1.24)
GFR calc Af Amer: >60 mL/min (ref >60)
GLUCOSE: 90 mg/dL (ref 65–99)
Potassium: 4 mmol/L (ref 3.5–5.1)
SODIUM: 140 mmol/L (ref 135–145)

## 2017-05-15 MED ORDER — BOOST / RESOURCE BREEZE PO LIQD CUSTOM
1.0000 | Freq: Three times a day (TID) | ORAL | Status: DC
  Administered 2017-05-15 – 2017-05-17 (×5): 1 via ORAL

## 2017-05-15 MED ORDER — OXYCODONE HCL 5 MG PO TABS: 5.0000 mg | ORAL_TABLET | ORAL | Status: DC | PRN

## 2017-05-15 MED ORDER — FUROSEMIDE 10 MG/ML IJ SOLN
20.0000 mg | Freq: Once | INTRAMUSCULAR | Status: AC
  Administered 2017-05-15: 20 mg via INTRAVENOUS
  Filled 2017-05-15: qty 2

## 2017-05-15 NOTE — Care Management Important Message (Signed)
Important Message  Patient Details  Name: Christian Hurley MRN: [id number] Date of Birth: [birth year]   Medicare Important Message Given:  Yes    Caren MacadamFuller, William Schake 05/15/2017, 12:16 PMImportant Message  Patient Details  Name: Christian Hurley MRN: [id number] Date of Birth: [birth year]   Medicare Important Message Given:  Yes    Caren MacadamFuller, Thais Silberstein 05/15/2017, 12:16 PM

## 2017-05-15 NOTE — Progress Notes (Addendum)
Initial Nutrition Assessment  DOCUMENTATION CODES:   Non-severe (moderate) malnutrition in context of acute illness/injury  INTERVENTION:   -Provide Boost Breeze po TID, each supplement provides 250 kcal and 9 grams of protein -Encourage PO intake -RD will continue to monitor  NUTRITION DIAGNOSIS:   Moderate Malnutrition related to acute illness, SBO as evidenced by percent weight loss, energy intake < 75% for > 7 days, mild fat depletion, mild muscle depletion.  GOAL:   Patient will meet greater than or equal to 90% of their needs  MONITOR:   PO intake, Supplement acceptance, Diet advancement, Labs, Weight trends, I & O's  REASON FOR ASSESSMENT:   Malnutrition Screening Tool   ASSESSMENT:   76 y.o.male,..With history of COPD, bipolar disorder was [facility] for abdominal pain. Patient says that he has had 3 days of abdominal pain and loose stools. Patient also has been having vomiting unable to eat and drink.  12/7: s/p  exploratory laparotomy, lysis of adhesions, removal of gallstone from distal jejunum and transverse closure  Patient in room sitting up and working on a puzzle. Pt states PTA he was not eating much at all d/t N/V. States the last time he ate a meal was 12/1, which consisted of a [employer] cheeseburger.. Pt states he usually gets 2 meals a day, states he is busy taking care of his wife. They typically eat oatmeal or cereal for breakfast and eat a late lunch/dinner. On a good day, he would eat 3 meals a day.  Since 12/2, pt has not had his diet advanced beyond clear liquids. He is tolerating his clear liquid diet, consumed 100% of a liquid tray today. [facility] to try [facility], will order.  Per patient, UBW is 189-190 lb. Pt weighed 187 lb at admission on 12/2. Pt has since lost 13 lb which Is 7% weight loss in 1 week.   Labs reviewed. Medications: IV Protonix daily  NUTRITION - FOCUSED PHYSICAL EXAM:    Most Recent Value   Orbital Region  Mild depletion  Upper Arm Region  Mild depletion  Thoracic and Lumbar Region  Unable to assess  Buccal Region  No depletion  Temple Region  Mild depletion  Clavicle Bone Region  No depletion  Clavicle and Acromion Bone Region  No depletion  Scapular Bone Region  Unable to assess  Dorsal Hand  No depletion  Patellar Region  Unable to assess  Anterior Thigh Region  Unable to assess  Posterior Calf Region  Unable to assess  Edema (RD Assessment)  None       Diet Order:  Diet clear liquid Room service appropriate? Yes; Fluid consistency: Thin  EDUCATION NEEDS:   Education needs have been addressed  Skin:  Skin Assessment: Reviewed RN Assessment  Last BM:  12/10  Height:   Ht Readings from Last 1 Encounters:  05/06/17 5\' 9"  (1.753 m)    Weight:   Wt Readings from Last 1 Encounters:  05/14/17 174 lb 1.6 oz (79 kg)    Ideal Body Weight:  72.7 kg  BMI:  Body mass index is 25.71 kg/m.  Estimated Nutritional Needs:   Kcal:  2200-2400  Protein:  110-120g  Fluid:  2.2L/day  Tilda FrancoLindsey Rebeca Valdivia, MS, RD, LDN [facility] Clinical Dietitian Pager: [phone] After Hours Pager: [phone]

## 2017-05-15 NOTE — Consult Note (Signed)
   [facility]   05/15/2017  Christian Hurley [birth year] [id number]    [facility].  Chart reviewed. Mr. [name] has had x1 hospitalization within the past 6 months. He has a lower risk for readmission score currently. He currently goes to a Primary Care office that performs their own transition of care calls post discharge.  Will continue to follow and engage if [facility] Care Management is needed.   Raiford NobleAtika Hall, MSN-Ed, RN,BSN [facility] Hospital Liaison [phone]

## 2017-05-15 NOTE — Progress Notes (Signed)
[facility] Progress Note  4 Days Post-Op  Subjective: CC:  Abdominal pain controlled. +flatus. 2 BMs. Denies nausea or emesis with NG tube clamped this AM.  Objective: Vital signs in last 24 hours: Temp:  [98.2 F (36.8 C)-98.5 F (36.9 C)] 98.2 F (36.8 C) (12/11 0631) Pulse Rate:  [81-88] 88 (12/11 0631) Resp:  [18] 18 (12/11 0631) BP: (137-142)/(64-74) 142/74 (12/11 0631) SpO2:  [94 %-95 %] 94 % (12/11 0631) Weight:  [79 kg (174 lb 1.6 oz)] 79 kg (174 lb 1.6 oz) (12/10 1245) Last BM Date: 05/14/17  Intake/Output from previous day: 12/10 0701 - 12/11 0700 In: 2062.1 [P.O.:210; I.V.:1852.1] Out: 1700 [Urine:1150; Emesis/NG output:550] Intake/Output this shift: No intake/output data recorded.  PE: Gen:  Alert, NAD, pleasant Pulm:  Normal effort Abd: Soft, appropriately tender, +BS, midline dressing c/d/i Skin: warm and dry, no rashes  Psych: A&Ox3   Lab Results:  Recent Labs    05/14/17 0446 05/15/17 0543  WBC 9.4 9.0  HGB 12.5* 12.8*  HCT 37.4* 38.1*  PLT 273 304   BMET Recent Labs    05/14/17 0446 05/15/17 0543  NA 142 140  K 3.8 4.0  CL 107 107  CO2 24 26  GLUCOSE 86 90  BUN 15 16  CREATININE 0.74 0.74  CALCIUM 8.3* 8.4*   PT/INR No results for input(s): LABPROT, INR in the last 72 hours. CMP     Component Value Date/Time   NA 140 05/15/2017 0543   K 4.0 05/15/2017 0543   CL 107 05/15/2017 0543   CO2 26 05/15/2017 0543   GLUCOSE 90 05/15/2017 0543   BUN 16 05/15/2017 0543   CREATININE 0.74 05/15/2017 0543   CALCIUM 8.4 (L) 05/15/2017 0543   PROT 6.9 05/07/2017 0505   ALBUMIN 3.9 05/07/2017 0505   AST 18 05/07/2017 0505   ALT 13 (L) 05/07/2017 0505   ALKPHOS 95 05/07/2017 0505   BILITOT 1.6 (H) 05/07/2017 0505   GFRNONAA >60 05/15/2017 0543   GFRAA >60 05/15/2017 0543   Lipase     Component Value Date/Time   LIPASE 27 05/06/2017 1415   Studies/Results: Dg Chest 2 View  Result Date: 05/14/2017 CLINICAL DATA:   Shortness of breath, rhonchi. EXAM: CHEST  2 VIEW COMPARISON:  06/19/2014. FINDINGS: Trachea is midline. Nasogastric tube terminates in the stomach. Thoracic aorta is calcified. Heart size normal. Right apical pleuroparenchymal scarring. Linear subsegmental atelectasis in the left lower lobe. Small bilateral pleural effusions. IMPRESSION: 1. Left lower lobe atelectasis. 2. Small bilateral pleural effusions. Electronically Signed   By: Leanna BattlesMelinda  Blietz M.D.   On: 05/14/2017 13:01   Anti-infectives: Anti-infectives (From admission, onward)   Start     Dose/Rate Route Frequency Ordered Stop   05/11/17 1418  cefoTEtan in Dextrose 5% (CEFOTAN) 2-2.08 GM-%(50ML) IVPB    Comments:  Wylene SimmerShepherd, Karen   : cabinet override      05/11/17 1418 05/12/17 0229   05/11/17 1000  cefoTEtan (CEFOTAN) 2 g in dextrose 5 % 50 mL IVPB     2 g 100 mL/hr over 30 Minutes Intravenous On call to O.R. 05/11/17 0946 05/11/17 1538     Assessment/Plan Bipolar disease - Lamictal/Depakote/fluvoxamine COPD Hx of bladder surgery  GERD  Small bowel obstruction with gallstone ileus History of ventral hernia repair x3 Bilateral inguinal hernias - not the source of the SBO  POD#4 S/P exploratory laparotomy, lysis of adhesions, removal of gallstone from distal jejunum and transverse closure, 05/11/17, Dr. Joseph ArtFiera Byerly.   -  afebrile, VSS, no leukocytosis  - bowel function returning: had 2 BMs, +flatus; D/C NGT and start clears - mobilize/IS  FEN: start clears  RU:EAVW:None Foley: none DVT: Lovenox Follow up:TBD     LOS: 9 days    Christian PhenixElizabeth S Jarrette Hurley , [facility] 05/15/2017, 10:18 AM Pager: [phone] Consults: [phone] Mon-Fri 7:00 am-4:30 pm Sat-Sun 7:00 am-11:30 am

## 2017-05-15 NOTE — Progress Notes (Addendum)
[facility]  PROGRESS NOTE  Christian Hurley ZOX:[id number] DOB: Nov 23, 1940 DOA: 05/06/2017   PCP: [name], Aaron, MD   Brief summary  [age] y.o.male,..With history of COPD, bipolar disorder was [facility] ED [facility] for abdominal pain. Patient says that he has had 3 days of abdominal pain and loose stools. Patient also has been having vomiting unable to eat and drink.  CT abdomen/pelvis was done which showed partial small bowel obstruction, left upper quadrant with small intussusception and probable adhesions. General surgery consulted.   Assessment/Plan:  Small bowel obstruction - s/p ex lap with lysis of adhesions, removal of gallstone from distal jejunum, post op day #4 - return of bowl function, remove NGT and advance diet to clears  - appreciate assistance of surgery team - mobilize - IS use encouraged   Cough 12/10 - somewhat dry - rhonchi on exam noted, still congested this AM  - CXR with small bilateral pleural effusions  - will stop IVF and give one dose of Lasix 20 mg IV  - weight since admission: Filed Weights   05/06/17 1438 05/14/17 1245  Weight: 84.8 kg (187 lb) 79 kg (174 lb 1.6 oz)   COPD - no wheezing noted this AM  - allow bronchodilators as needed  - IS encouraged   Bipolar disorder - resume Lamictal   GERD - continue IV Protonix.and change to PO once pt able to tolerate PO   No evidence of acute kidney injury  DVT prophylaxis: Lovenox SQ  Code Status: Full code  Family Communication: pt at bedside   Disposition Plan: home once surgery team clears   Consultants:  General surgery  Procedures:  Laparotomy 12/7   Antibiotics: Anti-infectives (From admission, onward)   Start     Dose/Rate Route Frequency Ordered Stop   05/11/17 1418  cefoTEtan in Dextrose 5% (CEFOTAN) 2-2.08 GM-%(50ML) IVPB    Comments:  Wylene SimmerShepherd, Karen   : cabinet override      05/11/17 1418 05/12/17 0229   05/11/17 1000  cefoTEtan (CEFOTAN)  2 g in dextrose 5 % 50 mL IVPB     2 g 100 mL/hr over 30 Minutes Intravenous On call to O.R. 05/11/17 0946 05/11/17 1538     HPI/Subjective: Pt reports feeling better.   Objective: Vitals:   05/14/17 2100 05/15/17 0631  BP: 137/64 (!) 142/74  Pulse: 81 88  Resp: 18 18  Temp: 98.5 F (36.9 C) 98.2 F (36.8 C)  SpO2: 95% 94%    Intake/Output Summary (Last 24 hours) at 05/15/2017 1328 Last data filed at 05/15/2017 1025 Gross per 24 hour  Intake 1282.09 ml  Output 1400 ml  Net -117.91 ml   Filed Weights   05/06/17 1438 05/14/17 1245  Weight: 84.8 kg (187 lb) 79 kg (174 lb 1.6 oz)   Physical Exam  Constitutional: Appears calm, NAD CVS: RRR, S1/S2 +, no murmurs, no gallops, no carotid bruit.  Pulmonary: Effort and breath sounds normal, mild rhonchi at bases  Abdominal: Soft. BS +,  no distension, tenderness Musculoskeletal: Normal range of motion. No edema and no tenderness.   Data Reviewed: Basic Metabolic Panel: Recent Labs  Lab 05/09/17 0525 05/10/17 0513 05/11/17 0423 05/13/17 0522 05/14/17 0446 05/15/17 0543  NA 142 142 142  --  142 140  K 4.0 3.8 4.0  --  3.8 4.0  CL 107 106 108  --  107 107  CO2 28 26 21*  --  24 26  GLUCOSE 92 72 73  --  86 90  BUN 20 21* 19  --  15 16  CREATININE 1.01 0.92 0.76 0.84 0.74 0.74  CALCIUM 8.7* 8.6* 8.5*  --  8.3* 8.4*  MG 2.1  --   --   --   --   --    CBC: Recent Labs  Lab 05/09/17 0525 05/11/17 0423 05/14/17 0446 05/15/17 0543  WBC 7.3 7.4 9.4 9.0  HGB 13.6 14.0 12.5* 12.8*  HCT 42.8 41.6 37.4* 38.1*  MCV 96.8 92.4 94.0 93.8  PLT 256 226 273 304    Recent Results (from the past 240 hour(s))  Blood Culture (routine x 2)     Status: None   Collection Time: 05/06/17  1:46 PM  Result Value Ref Range Status   Specimen Description BLOOD BLOOD RIGHT FOREARM  Final   Special Requests   Final    BOTTLES DRAWN AEROBIC ONLY Blood Culture adequate volume   Culture   Final    NO GROWTH 5 DAYS Performed at [facility], [address] [place], [place] [address]    Report Status 05/11/2017 FINAL  Final  Blood Culture (routine x 2)     Status: None   Collection Time: 05/06/17  2:18 PM  Result Value Ref Range Status   Specimen Description BLOOD BLOOD LEFT FOREARM  Final   Special Requests   Final    BOTTLES DRAWN AEROBIC AND ANAEROBIC Blood Culture adequate volume   Culture   Final    NO GROWTH 5 DAYS Performed at [facility], [address] [place], [place] [address]    Report Status 05/11/2017 FINAL  Final     Studies: Dg Chest 2 View  Result Date: 05/14/2017 CLINICAL DATA:  Shortness of breath, rhonchi. EXAM: CHEST  2 VIEW COMPARISON:  06/19/2014. FINDINGS: Trachea is midline. Nasogastric tube terminates in the stomach. Thoracic aorta is calcified. Heart size normal. Right apical pleuroparenchymal scarring. Linear subsegmental atelectasis in the left lower lobe. Small bilateral pleural effusions. IMPRESSION: 1. Left lower lobe atelectasis. 2. Small bilateral pleural effusions. Electronically Signed   By: Leanna BattlesMelinda  Blietz M.D.   On: 05/14/2017 13:01    Scheduled Meds: . enoxaparin (LOVENOX) injection  40 mg Subcutaneous Q24H  . fluvoxaMINE  100 mg Oral BID  . lamoTRIgine  100 mg Oral BID  . mouth rinse  15 mL Mouth Rinse BID  . pantoprazole (PROTONIX) IV  40 mg Intravenous Q24H  . pramipexole  0.125 mg Oral QHS   Continuous Infusions:   Time spent: 25 minutes   Chantil Bari Magick-Tija Biss  [facility] Pager [phone]. If 7PM-7AM, please contact night-coverage at [email or web address], password [email or web address] 05/15/2017, 1:28 PM  LOS: 9 days

## 2017-05-16 LAB — BASIC METABOLIC PANEL
Anion gap: 8 (ref 5–15)
BUN: 15 mg/dL (ref 6–20)
CALCIUM: 8.7 mg/dL — AB (ref 8.9–10.3)
CO2: 27 mmol/L (ref 22–32)
Chloride: 104 mmol/L (ref 101–111)
Creatinine, Ser: 0.79 mg/dL (ref 0.61–1.24)
GFR calc Af Amer: >60 mL/min (ref >60)
GLUCOSE: 116 mg/dL — AB (ref 65–99)
Potassium: 3.6 mmol/L (ref 3.5–5.1)
Sodium: 139 mmol/L (ref 135–145)

## 2017-05-16 LAB — CBC
HCT: 36.4 % — ABNORMAL LOW (ref 39.0–52.0)
Hemoglobin: 12.1 g/dL — ABNORMAL LOW (ref 13.0–17.0)
MCH: 30.9 pg (ref 26.0–34.0)
MCHC: 33.2 g/dL (ref 30.0–36.0)
MCV: 93.1 fL (ref 78.0–100.0)
PLATELETS: 342 10*3/uL (ref 150–400)
RBC: 3.91 MIL/uL — ABNORMAL LOW (ref 4.22–5.81)
RDW: 13.2 % (ref 11.5–15.5)
WBC: 8.2 10*3/uL (ref 4.0–10.5)

## 2017-05-16 MED ORDER — PANTOPRAZOLE SODIUM 40 MG PO TBEC
40.0000 mg | DELAYED_RELEASE_TABLET | Freq: Every day | ORAL | Status: DC
  Administered 2017-05-16 – 2017-05-17 (×2): 40 mg via ORAL
  Filled 2017-05-16 (×2): qty 1

## 2017-05-16 MED ORDER — FUROSEMIDE 10 MG/ML IJ SOLN
20.0000 mg | Freq: Once | INTRAMUSCULAR | Status: AC
  Administered 2017-05-16: 20 mg via INTRAVENOUS
  Filled 2017-05-16: qty 2

## 2017-05-16 MED ORDER — OXYCODONE HCL 5 MG PO TABS: 5.0000 mg | ORAL_TABLET | ORAL | 0 refills | Status: AC | PRN

## 2017-05-16 MED ORDER — ALBUTEROL SULFATE (2.5 MG/3ML) 0.083% IN NEBU: 2.5000 mg | INHALATION_SOLUTION | RESPIRATORY_TRACT | 12 refills | Status: AC | PRN

## 2017-05-16 NOTE — Progress Notes (Signed)
[facility]  PROGRESS NOTE  Abelardo Dieselarl W Winberg ZOX:[id number] DOB: 1941-01-02 DOA: 05/06/2017   PCP: [name], Aaron, MD   Brief summary  76 y.o.male,..With history of COPD, bipolar disorder [facility]. Patient says that he has had 3 days of abdominal pain and loose stools. Patient also has been having vomiting unable to eat and drink.  CT abdomen/pelvis was done which showed partial small bowel obstruction, left upper quadrant with small intussusception and probable adhesions. General surgery consulted.   Assessment/Plan:  Small bowel obstruction - s/p ex lap with lysis of adhesions, removal of gallstone from distal jejunum, post op day #5 - return of bowl function, NGT has been removed - pt tolerated full liquid diet, per surgery, plan to advance to soft today  - appreciate assistance of surgery team - if tolerating soft diet, can likely go home in AM  Cough 12/10 - somewhat dry - rhonchi on exam noted - CXR with small bilateral pleural effusions  - IVF have been stopped, pt still congested, will give one more dose of Lasix IV today  - weight since admission: Filed Weights   05/06/17 1438 05/14/17 1245 05/16/17 0500  Weight: 84.8 kg (187 lb) 79 kg (174 lb 1.6 oz) 78 kg (172 lb 1 oz)   COPD - no wheezing noted this AM  - BD as needed - IS while awake   Bipolar disorder - resumed Lamictal   GERD - continue IV Protonix.and change to PO once pt able to tolerate PO   No evidence of acute kidney injury  DVT prophylaxis: Lovenox SQ  Code Status: Full code  Family Communication: pt at bedside   Disposition Plan: home in 24 hours if tolerating soft diet well   Consultants:  General surgery  Procedures:  Laparotomy 12/7   Antibiotics: Anti-infectives (From admission, onward)   Start     Dose/Rate Route Frequency Ordered Stop   05/11/17 1418  cefoTEtan in Dextrose 5% (CEFOTAN) 2-2.08 GM-%(50ML) IVPB     Comments:  Wylene SimmerShepherd, Karen   : cabinet override      05/11/17 1418 05/12/17 0229   05/11/17 1000  cefoTEtan (CEFOTAN) 2 g in dextrose 5 % 50 mL IVPB     2 g 100 mL/hr over 30 Minutes Intravenous On call to O.R. 05/11/17 0946 05/11/17 1538     HPI/Subjective: No events overnight.   Objective: Vitals:   05/15/17 2116 05/16/17 0613  BP: 111/68 123/63  Pulse: 95 73  Resp: 18 16  Temp: 98.6 F (37 C) 98.1 F (36.7 C)  SpO2: 97% 95%    Intake/Output Summary (Last 24 hours) at 05/16/2017 0919 Last data filed at 05/16/2017 0800 Gross per 24 hour  Intake 1080 ml  Output 1005 ml  Net 75 ml   Filed Weights   05/06/17 1438 05/14/17 1245 05/16/17 0500  Weight: 84.8 kg (187 lb) 79 kg (174 lb 1.6 oz) 78 kg (172 lb 1 oz)   Physical Exam  Constitutional: Appears calm, NAD CVS: RRR, S1/S2 +, no murmurs, no gallops, no carotid bruit.  Pulmonary: Effort and breath sounds normal, no stridor, rhonchi, wheezes, rales.  Abdominal: Soft. BS +,  no distension, tenderness, rebound or guarding.  Musculoskeletal: Normal range of motion. No edema and no tenderness.   Data Reviewed: Basic Metabolic Panel: Recent Labs  Lab 05/10/17 0513 05/11/17 0423 05/13/17 0522 05/14/17 0446 05/15/17 0543 05/16/17 0536  NA 142 142  --  142 140 139  K 3.8 4.0  --  3.8 4.0 3.6  CL 106 108  --  107 107 104  CO2 26 21*  --  24 26 27   GLUCOSE 72 73  --  86 90 116*  BUN 21* 19  --  15 16 15   CREATININE 0.92 0.76 0.84 0.74 0.74 0.79  CALCIUM 8.6* 8.5*  --  8.3* 8.4* 8.7*   CBC: Recent Labs  Lab 05/11/17 0423 05/14/17 0446 05/15/17 0543 05/16/17 0536  WBC 7.4 9.4 9.0 8.2  HGB 14.0 12.5* 12.8* 12.1*  HCT 41.6 37.4* 38.1* 36.4*  MCV 92.4 94.0 93.8 93.1  PLT 226 273 304 342    Recent Results (from the past 240 hour(s))  Blood Culture (routine x 2)     Status: None   Collection Time: 05/06/17  1:46 PM  Result Value Ref Range Status   Specimen Description BLOOD BLOOD RIGHT FOREARM  Final    Special Requests   Final    BOTTLES DRAWN AEROBIC ONLY Blood Culture adequate volume   Culture   Final    NO GROWTH 5 DAYS Performed at [facility], [address] [place], [place] [address]    Report Status 05/11/2017 FINAL  Final  Blood Culture (routine x 2)     Status: None   Collection Time: 05/06/17  2:18 PM  Result Value Ref Range Status   Specimen Description BLOOD BLOOD LEFT FOREARM  Final   Special Requests   Final    BOTTLES DRAWN AEROBIC AND ANAEROBIC Blood Culture adequate volume   Culture   Final    NO GROWTH 5 DAYS Performed at [facility], [address] [place], [place] [address]    Report Status 05/11/2017 FINAL  Final    Studies: Dg Chest 2 View  Result Date: 05/14/2017 CLINICAL DATA:  Shortness of breath, rhonchi. EXAM: CHEST  2 VIEW COMPARISON:  06/19/2014. FINDINGS: Trachea is midline. Nasogastric tube terminates in the stomach. Thoracic aorta is calcified. Heart size normal. Right apical pleuroparenchymal scarring. Linear subsegmental atelectasis in the left lower lobe. Small bilateral pleural effusions. IMPRESSION: 1. Left lower lobe atelectasis. 2. Small bilateral pleural effusions. Electronically Signed   By: Leanna BattlesMelinda  Blietz M.D.   On: 05/14/2017 13:01    Scheduled Meds: . enoxaparin (LOVENOX) injection  40 mg Subcutaneous Q24H  . feeding supplement  1 Container Oral TID BM  . fluvoxaMINE  100 mg Oral BID  . furosemide  20 mg Intravenous Once  . lamoTRIgine  100 mg Oral BID  . mouth rinse  15 mL Mouth Rinse BID  . pantoprazole (PROTONIX) IV  40 mg Intravenous Q24H  . pramipexole  0.125 mg Oral QHS   Continuous Infusions:   Time spent: 25 minutes   Eleina Jergens Magick-Rayah Fines  [facility] Pager [phone]. If 7PM-7AM, please contact night-coverage at [email or web address], password [email or web address] 05/16/2017, 9:19 AM  LOS: 10 days

## 2017-05-16 NOTE — Progress Notes (Signed)
Key Points: Use following P&T approved IV to PO non-antibiotic change policy.  Description contains the criteria that are approved Note: Policy Excludes:  Esophagectomy patientsPHARMACIST - PHYSICIAN COMMUNICATION DR:   Christian Hurley CONCERNING: IV to Oral Route Change Policy  RECOMMENDATION: This patient is receiving protonix by the intravenous route.  Based on criteria approved by the Pharmacy and Therapeutics Committee, the intravenous medication(s) is/are being converted to the equivalent oral dose form(s).   DESCRIPTION: These criteria include:  The patient is eating (either orally or via tube) and/or has been taking other orally administered medications for a least 24 hours  The patient has no evidence of active gastrointestinal bleeding or impaired GI absorption (gastrectomy, short bowel, patient on TNA or NPO).  If you have questions about this conversion, please contact the Pharmacy Department  []   [phone]( [phone] )  Christian HawkingAnnie Hurley []   [phone]( [phone]  Christian Hurley  []   [phone]( [phone] )  Christian Hurley [x]   [phone]( [phone] )  [facility]  Christian Hurley, Christian Hurley Edwards AFBMarshall, [facility] 05/16/2017 9:44 AM

## 2017-05-16 NOTE — Progress Notes (Signed)
[facility] Progress Note  5 Days Post-Op  Subjective: CC:  Abdominal pain controlled - mild incisional soreness. Tolerating clears. +flatus and BMs. Reports walking 4 laps independently in the hall. Urinating without hesitancy.   Objective: Vital signs in last 24 hours: Temp:  [98.1 F (36.7 C)-98.6 F (37 C)] 98.1 F (36.7 C) (12/12 0613) Pulse Rate:  [73-95] 73 (12/12 0613) Resp:  [16-18] 16 (12/12 0613) BP: (111-123)/(63-68) 123/63 (12/12 0613) SpO2:  [95 %-100 %] 95 % (12/12 0613) Weight:  [78 kg (172 lb 1 oz)] 78 kg (172 lb 1 oz) (12/12 0500) Last BM Date: 05/14/17  Intake/Output from previous day: 12/11 0701 - 12/12 0700 In: 1080 [P.O.:1080] Out: 805 [Urine:805] Intake/Output this shift: Total I/O In: 0  Out: 200 [Urine:200]  PE: Gen:  Alert, NAD, pleasant Pulm:  Normal effort Abd: Soft, appropriately tender, +BS, midline dressing removed - staples c/d/i  Skin: warm and dry, no rashes   Lab Results:  Recent Labs    05/15/17 0543 05/16/17 0536  WBC 9.0 8.2  HGB 12.8* 12.1*  HCT 38.1* 36.4*  PLT 304 342   BMET Recent Labs    05/15/17 0543 05/16/17 0536  NA 140 139  K 4.0 3.6  CL 107 104  CO2 26 27  GLUCOSE 90 116*  BUN 16 15  CREATININE 0.74 0.79  CALCIUM 8.4* 8.7*   PT/INR No results for input(s): LABPROT, INR in the last 72 hours. CMP     Component Value Date/Time   NA 139 05/16/2017 0536   K 3.6 05/16/2017 0536   CL 104 05/16/2017 0536   CO2 27 05/16/2017 0536   GLUCOSE 116 (H) 05/16/2017 0536   BUN 15 05/16/2017 0536   CREATININE 0.79 05/16/2017 0536   CALCIUM 8.7 (L) 05/16/2017 0536   PROT 6.9 05/07/2017 0505   ALBUMIN 3.9 05/07/2017 0505   AST 18 05/07/2017 0505   ALT 13 (L) 05/07/2017 0505   ALKPHOS 95 05/07/2017 0505   BILITOT 1.6 (H) 05/07/2017 0505   GFRNONAA >60 05/16/2017 0536   GFRAA >60 05/16/2017 0536   Lipase     Component Value Date/Time   LIPASE 27 05/06/2017 1415       Studies/Results: Dg  Chest 2 View  Result Date: 05/14/2017 CLINICAL DATA:  Shortness of breath, rhonchi. EXAM: CHEST  2 VIEW COMPARISON:  06/19/2014. FINDINGS: Trachea is midline. Nasogastric tube terminates in the stomach. Thoracic aorta is calcified. Heart size normal. Right apical pleuroparenchymal scarring. Linear subsegmental atelectasis in the left lower lobe. Small bilateral pleural effusions. IMPRESSION: 1. Left lower lobe atelectasis. 2. Small bilateral pleural effusions. Electronically Signed   By: Leanna BattlesMelinda  Blietz M.D.   On: 05/14/2017 13:01    Anti-infectives: Anti-infectives (From admission, onward)   Start     Dose/Rate Route Frequency Ordered Stop   05/11/17 1418  cefoTEtan in Dextrose 5% (CEFOTAN) 2-2.08 GM-%(50ML) IVPB    Comments:  Wylene SimmerShepherd, Karen   : cabinet override      05/11/17 1418 05/12/17 0229   05/11/17 1000  cefoTEtan (CEFOTAN) 2 g in dextrose 5 % 50 mL IVPB     2 g 100 mL/hr over 30 Minutes Intravenous On call to O.R. 05/11/17 0946 05/11/17 1538       Assessment/Plan Bipolar disease - Lamictal/Depakote/fluvoxamine COPD Hx of bladder surgery  GERD  Small bowel obstruction with gallstone ileus History of ventral hernia repair x3 Bilateral inguinal hernias - not the source of the SBO  POD#5 S/P exploratory laparotomy,  lysis of adhesions, removal of gallstone from distal jejunum and transverse closure, 05/11/17, Dr. Dois DavenportFieraByerly.  - afebrile, VSS, no leukocytosis  - bowel function returning: had 2 BMs, +flatus - NGT d/c-ed 12/12 - mobilize/IS  FEN: full liquids, advance to SOFT as tolerated. ZO:XWRU:None Foley: none DVT: Lovenox Follow up:[facility] office for staple removal 12/20, F/U Dr. Donell BeersByerly 2-4 weeks.   Pain controlled. Mobilizing independently. Having bowel function. Stable for discharge from surgical perspective once tolerating soft diet.   LOS: 10 days    Adam PhenixElizabeth S Simaan , [facility] 05/16/2017, 8:46 AM Pager: [phone] Consults:  [phone] Mon-Fri 7:00 am-4:30 pm Sat-Sun 7:00 am-11:30 am

## 2017-05-16 NOTE — Progress Notes (Signed)
Physical Therapy Treatment Patient Details Name: Christian Hurley MRN: [id number] DOB: 03/25/1941 Today's Date: 05/16/2017    History of Present Illness 76 y.o. male  With history of COPD, bipolar disorder [facility].  Patient has had 3 days of abdominal pain and loose stool;  s/p ex lap with lysis of adhesions, removal of gallstone from distal jejunum       PT Comments    Pt motivated and progressing well with ambulation.  Pt ambulating unassisted in hall and including back step and side step with good safety awareness and no LOB.  Pt reports he is at or close to baseline.  PT to sign off at this time.     Follow Up Recommendations  No PT follow up     Equipment Recommendations       Recommendations for Other Services       Precautions / Restrictions Precautions Precautions: Fall Restrictions Weight Bearing Restrictions: No    Mobility  Bed Mobility Overal bed mobility: Modified Independent             General bed mobility comments: Pt up on side of bed and states he has been performing bed mobility unassisted  Transfers Overall transfer level: Modified independent                  Ambulation/Gait Ambulation/Gait assistance: Independent Ambulation Distance (Feet): 500 Feet Assistive device: None       General Gait Details: Pt ambulated sans AD and with good safety awareness and no LOB.  Pt reports he is close to baseline    Information systems managertairs            Wheelchair Mobility    Modified Rankin (Stroke Patients Only)       Balance   Sitting-balance support: No upper extremity supported;Feet supported Sitting balance-Leahy Scale: Normal       Standing balance-Leahy Scale: Good                              Cognition Arousal/Alertness: Awake/alert Behavior During Therapy: WFL for tasks assessed/performed Overall Cognitive Status: Within Functional Limits for tasks assessed                                        Exercises      General Comments        Pertinent Vitals/Pain Pain Assessment: No/denies pain    Home Living                      Prior Function            PT Goals (current goals can now be found in the care plan section) Acute Rehab PT Goals Patient Stated Goal: get better PT Goal Formulation: With patient Time For Goal Achievement: 05/28/17 Potential to Achieve Goals: Good Progress towards PT goals: Progressing toward goals    Frequency    Min 3X/week      PT Plan Discharge plan needs to be updated    Co-evaluation              AM-PAC PT "6 Clicks" Daily Activity  Outcome Measure  Difficulty turning over in bed (including adjusting bedclothes, sheets and blankets)?: None Difficulty moving from lying on back to sitting on the side of the bed? :  None Difficulty sitting down on and standing up from a chair with arms (e.g., wheelchair, bedside commode, etc,.)?: None Help needed moving to and from a bed to chair (including a wheelchair)?: None Help needed walking in hospital room?: None Help needed climbing 3-5 steps with a railing? : A Little 6 Click Score: 23    End of Session   Activity Tolerance: Patient tolerated treatment well Patient left: Other (comment)(sitting EOB) Nurse Communication: Mobility status PT Visit Diagnosis: Difficulty in walking, not elsewhere classified (R26.2)     Time: 8119-14781014-1025 PT Time Calculation (min) (ACUTE ONLY): 11 min  Charges:  $Gait Training: 8-22 mins                    G Codes:       Pg [phone]    Kilie Rund 05/16/2017, 10:42 AM

## 2017-05-16 NOTE — Progress Notes (Signed)
Spoke with patient at bedside. Discussed order for HHPT, patient states he is walking independently and does not think he needs PT, PT evaluation in agreement. Patient denies other HH needs. Will continue to follow. [phone]

## 2017-05-17 DIAGNOSIS — K56609 Unspecified intestinal obstruction, unspecified as to partial versus complete obstruction: Secondary | ICD-10-CM

## 2017-05-17 LAB — CBC
HCT: 36.4 % — ABNORMAL LOW (ref 39.0–52.0)
Hemoglobin: 12.1 g/dL — ABNORMAL LOW (ref 13.0–17.0)
MCH: 31.1 pg (ref 26.0–34.0)
MCHC: 33.2 g/dL (ref 30.0–36.0)
MCV: 93.6 fL (ref 78.0–100.0)
PLATELETS: 358 10*3/uL (ref 150–400)
RBC: 3.89 MIL/uL — AB (ref 4.22–5.81)
RDW: 13.2 % (ref 11.5–15.5)
WBC: 9.3 10*3/uL (ref 4.0–10.5)

## 2017-05-17 LAB — BASIC METABOLIC PANEL
Anion gap: 8 (ref 5–15)
BUN: 15 mg/dL (ref 6–20)
CO2: 30 mmol/L (ref 22–32)
CREATININE: 1.07 mg/dL (ref 0.61–1.24)
Calcium: 8.7 mg/dL — ABNORMAL LOW (ref 8.9–10.3)
Chloride: 101 mmol/L (ref 101–111)
Glucose, Bld: 121 mg/dL — ABNORMAL HIGH (ref 65–99)
Potassium: 3.4 mmol/L — ABNORMAL LOW (ref 3.5–5.1)
SODIUM: 139 mmol/L (ref 135–145)

## 2017-05-17 MED ORDER — POTASSIUM CHLORIDE CRYS ER 20 MEQ PO TBCR
40.0000 meq | EXTENDED_RELEASE_TABLET | Freq: Once | ORAL | Status: AC
  Administered 2017-05-17: 40 meq via ORAL
  Filled 2017-05-17: qty 2

## 2017-05-17 NOTE — Discharge Instructions (Signed)
[facility]      [facility] [phone]  OPEN ABDOMINAL SURGERY: POST OP INSTRUCTIONS  Always review your discharge instruction sheet given to you by the facility where your surgery was performed.  IF YOU HAVE DISABILITY OR FAMILY LEAVE FORMS, YOU MUST BRING THEM TO THE OFFICE FOR PROCESSING.  PLEASE DO NOT GIVE THEM TO YOUR DOCTOR.  1. A prescription for pain medication may be given to you upon discharge.  Take your pain medication as prescribed, if needed.  If narcotic pain medicine is not needed, then you may take acetaminophen (Tylenol) or ibuprofen (Advil) as needed. 2. Take your usually prescribed medications unless otherwise directed. 3. If you need a refill on your pain medication, please contact your pharmacy. They will contact our office to request authorization.  Prescriptions will not be filled after 5pm or on week-ends. 4. You should follow a light diet the first few days after arrival home, such as soup and crackers, pudding, etc.unless your doctor has advised otherwise. A high-fiber, low fat diet can be resumed as tolerated.   Be sure to include lots of fluids daily. Most patients will experience some swelling and bruising on the chest and neck area.  Ice packs will help.  Swelling and bruising can take several days to resolve 5. Most patients will experience some swelling and bruising in the area of the incision. Ice pack will help. Swelling and bruising can take several days to resolve..  6. It is common to experience some constipation if taking pain medication after surgery.  Increasing fluid intake and taking a stool softener will usually help or prevent this problem from occurring.  A mild laxative (Milk of Magnesia or Miralax) should be taken according to package directions if there are no bowel movements after 48 hours. 7.  You may have steri-strips (small skin tapes) in place directly over the incision.  These strips should be left on the skin for 7-10 days.  If your  surgeon used skin glue on the incision, you may shower in 24 hours.  The glue will flake off over the next 2-3 weeks.  Any sutures or staples will be removed at the office during your follow-up visit. You may find that a light gauze bandage over your incision may keep your staples from being rubbed or pulled. You may shower and replace the bandage daily. 8. ACTIVITIES:  You may resume regular (light) daily activities beginning the next day--such as daily self-care, walking, climbing stairs--gradually increasing activities as tolerated.  You may have sexual intercourse when it is comfortable.  Refrain from any heavy lifting or straining until approved by your doctor. a. You may drive when you no longer are taking prescription pain medication, you can comfortably wear a seatbelt, and you can safely maneuver your car and apply brakes b. Return to Work: ___________________________________ 9. You should see your doctor in the office for a follow-up appointment approximately two weeks after your surgery.  Make sure that you call for this appointment within a day or two after you arrive home to insure a convenient appointment time. OTHER INSTRUCTIONS:  _____________________________________________________________ _____________________________________________________________  WHEN TO CALL YOUR DOCTOR: 1. Fever over 101.0 2. Inability to urinate 3. Nausea and/or vomiting 4. Extreme swelling or bruising 5. Continued bleeding from incision. 6. Increased pain, redness, or drainage from the incision. 7. Difficulty swallowing or breathing 8. Muscle cramping or spasms. 9. Numbness or tingling in hands or feet or around lips.  The clinic staff is available to   answer your questions during regular business hours.  Please don't hesitate to call and ask to speak to one of the nurses if you have concerns.  For further questions, please visit [email or web address]   

## 2017-05-17 NOTE — Discharge Summary (Signed)
Physician Discharge Summary  Christian Hurley ZOX:[id number] DOB: May 13, 1941 DOA: 05/06/2017  PCP: Farris Has, MD  Admit date: 05/06/2017 Discharge date: 05/17/2017  Recommendations for Outpatient Follow-up:  1. Pt will need to follow up with PCP in 2-3 weeks post discharge 2. Please obtain BMP to evaluate electrolytes and kidney function 3. Please also check CBC to evaluate Hg and Hct levels  Discharge Diagnoses:  Active Problems:   Small bowel obstruction (HCC)   Encounter for imaging study to confirm nasogastric (NG) tube placement   Encounter for nasogastric (NG) tube placement   COPD with chronic bronchitis (HCC)    Discharge Condition: Stable  Diet recommendation: Heart healthy diet discussed in details   History of present illness:  76 y.o.male,..With history of COPD, bipolar disorder was sent to ED from [facility] walk-in clinic for abdominal pain. Patient says that he has had 3 days of abdominal pain and loose stools. Patient also has been having vomiting unable to eat and drink.  CT abdomen/pelvis was done which showed partial small bowel obstruction, left upper quadrant with small intussusception and probable adhesions. General surgery consulted.   Hospital Course:  Small bowel obstruction - s/p ex lap with lysis of adhesions, removal of gallstone from distal jejunum, post op day #5 - return of bowl function, NGT has been removed - pt tolerating soft diet - stable for discharge   Cough 12/10 - CXR with small bilateral pleural effusions  - Stable this am, no reports of coughing  COPD - Stable resp status   Bipolar disorder - Continue Lamictal  GERD - Continue PPI therapy    DVT prophylaxis:Lovenox SQ Code Status:Full code Family Communication:pt at bedside   Consultants:  General surgery  Procedures:  Laparotomy 12/7  Dg Chest 2 View  Result Date: 05/14/2017 CLINICAL DATA:  Shortness of breath, rhonchi. EXAM: CHEST  2 VIEW  COMPARISON:  06/19/2014. FINDINGS: Trachea is midline. Nasogastric tube terminates in the stomach. Thoracic aorta is calcified. Heart size normal. Right apical pleuroparenchymal scarring. Linear subsegmental atelectasis in the left lower lobe. Small bilateral pleural effusions. IMPRESSION: 1. Left lower lobe atelectasis. 2. Small bilateral pleural effusions. Electronically Signed   By: Leanna Battles M.D.   On: 05/14/2017 13:01   Dg Abd 1 View  Result Date: 05/11/2017 CLINICAL DATA:  Small-bowel obstruction. EXAM: ABDOMEN - 1 VIEW COMPARISON:  05/10/2017.  05/09/2017 . FINDINGS: NG tube coiled in a hiatal hernia noted. Distended loops of small bowel again noted. Oral contrast in the colon. Colon is nondistended. Findings consist with persistent partial small bowel obstruction. Degenerative changes thoracic spine with scoliosis concave left . IMPRESSION: 1. NG tube noted coiled in a hiatal hernia. 2. Persistent dilated loops of small bowel again noted. Oral contrast in the colon. Colon is nondistended. Findings consist with persistent small-bowel obstruction. No significant interim change. Electronically Signed   By: Maisie Fus  Register   On: 05/11/2017 09:25   Dg Abd 1 View  Result Date: 05/08/2017 CLINICAL DATA:  Small-bowel obstruction. Abdominal pain and distention. NG tube recently pole about. EXAM: ABDOMEN - 1 VIEW COMPARISON:  05/07/2017. FINDINGS: NG tube no longer visualized. Small-bowel distention again noted. Oral contrast noted in the small bowel and colon. Stool in the colon. No free air identified. IMPRESSION: 1. NG tube is been removed. 2. Persistent small bowel distention. Oral contrast noted in the small bowel and colon. Electronically Signed   By: Maisie Fus  Register   On: 05/08/2017 11:46   Ct Abdomen Pelvis W Contrast  Result Date: 05/06/2017 CLINICAL DATA:  Abdominal pain since yesterday, nausea. History of gallstones and recurrent hernia. EXAM: CT ABDOMEN AND PELVIS WITH CONTRAST  TECHNIQUE: Multidetector CT imaging of the abdomen and pelvis was performed using the standard protocol following bolus administration of intravenous contrast. CONTRAST:  100mL ISOVUE-300 IOPAMIDOL (ISOVUE-300) INJECTION 61% COMPARISON:  CT abdomen and pelvis September 07, 2010 FINDINGS: LOWER CHEST: Lung bases are clear. Included heart size is normal. No pericardial effusion. Bilateral fat containing small diaphragm attic hernia is are unchanged. Mild bronchial wall thickening. HEPATOBILIARY: Homogeneously hypodense cysts in the liver measuring to 3.4 cm. Status post cholecystectomy. PANCREAS: Normal. SPLEEN: Normal. ADRENALS/URINARY TRACT: Kidneys are orthotopic, demonstrating symmetric enhancement. No nephrolithiasis, hydronephrosis or solid renal masses. Too small to characterize hypodensities RIGHT kidney. 1 cm cyst lower pole LEFT kidney. The unopacified ureters are normal in course and caliber. Delayed imaging through the kidneys demonstrates symmetric prompt contrast excretion within the proximal urinary collecting system. Urinary bladder is partially distended, dome of the bladder extends toward but not into the RIGHT inguinal hernia. Normal adrenal glands. STOMACH/BOWEL: The dilated small bowel in LEFT upper quadrant measuring 2 3.7 cm with air-fluid levels and small bowel feces. Transition point LEFT upper quadrant a small intussusception. Associated mesenteric edema. Moderate descending and sigmoid colonic diverticulosis. VASCULAR/LYMPHATIC: Aortoiliac vessels are normal in course and caliber. Moderate calcific atherosclerosis. Chronic infrarenal aortic dissection. No lymphadenopathy by CT size criteria. REPRODUCTIVE: Normal. OTHER: Small volume ascites LEFT upper quadrant and pelvis. No drainable fluid collection. No intraperitoneal free air. Moderate LEFT and small RIGHT inguinal fat containing hernias with minimal free fluid. MUSCULOSKELETAL: Nonacute. Anterior abdominal wall scarring. Severe degenerative  change of the lower lumbar spine. IMPRESSION: 1. Partial small bowel obstruction LEFT upper quadrant with small intussusception, and probable adhesions. Small volume ascites. 2. Status post cholecystectomy. 3. Colonic diverticulosis without acute diverticulitis. Electronically Signed   By: Awilda Metroourtnay  Bloomer M.D.   On: 05/06/2017 15:53   Dg Abd 2 Views  Result Date: 05/10/2017 CLINICAL DATA:  COPD. EXAM: ABDOMEN - 2 VIEW COMPARISON:  05/09/2017.  CT 05/06/2017. FINDINGS: NG tube noted in stable position. Persistent distended loops of small bowel air-fluid levels noted. Findings consistent with persistent partial small-bowel obstruction. Oral contrast is noted colon. Degenerative changes lumbar spine. No acute bony abnormality . IMPRESSION: 1. NG tube in stable position. 2. Persistent small-bowel distention with air-fluid levels. Oral contrast in the colon. Colon is nondistended. These findings are consistent with persistent partial small-bowel obstruction. Electronically Signed   By: Maisie Fushomas  Register   On: 05/10/2017 08:41   Dg Abd 2 Views  Result Date: 05/09/2017 CLINICAL DATA:  Follow-up small bowel obstruction EXAM: ABDOMEN - 2 VIEW COMPARISON:  Supine abdominal radiographs of 08 May 2017 FINDINGS: There remain loops of mildly distended gas and fluid-filled small bowel. There is contrast within the colon and rectum which has migrated slightly more distally since yesterday's study. There remain loops of moderately distended gas and fluid-filled small bowel in the mid and lower abdomen. No free extraluminal gas collections are observed. There is gas and fluid within the stomach. IMPRESSION: Persistent small bowel obstruction. Mild distal migration of colonic contrast. Electronically Signed   By: David  SwazilandJordan M.D.   On: 05/09/2017 08:34   Dg Abd Portable 1v  Result Date: 05/09/2017 CLINICAL DATA:  Nasogastric placement EXAM: PORTABLE ABDOMEN - 1 VIEW COMPARISON:  Earlier same day FINDINGS:  Nasogastric tube enters the stomach in has its tip in the antrum or proximal duodenum. Contrast  remains in the colon. Small bowel obstruction pattern remains evident on this supine exam. IMPRESSION: Nasogastric tube tip in the antrum or proximal duodenum. Electronically Signed   By: Paulina FusiMark  Shogry M.D.   On: 05/09/2017 15:31   Dg Abd Portable 1v-small Bowel Obstruction Protocol-initial, 8 Hr Delay  Result Date: 05/08/2017 CLINICAL DATA:  This study identified missing a report at 8:34 am on 05/08/2017. 76 year old male with suspected bowel obstruction. Post 8 hour oral contrast image utilizing NG tube. EXAM: PORTABLE ABDOMEN - 1 VIEW COMPARISON:  05/07/2017 and earlier. FINDINGS: Portable AP view at 2343 hours on 05/07/2017. Enteric tube tip remains in the proximal stomach. The side hole is at the gastroesophageal junction level now. Oral contrast was administered and is present in the gastric fundus and multiple dilated small bowel loops in the left abdomen. No oral contrast visible in the right lower quadrant or proximal colon. Estimated left abdominal small bowel loops size up to 40 mm diameter. Colonic gas volume has mildly improved since 05/06/2017. Stable lung bases. Stable visualized osseous structures. IMPRESSION: 1. NG tube side hole up the level of the GEJ. Recommend advancing the tube 5 cm to ensure side hole placement in the stomach. 2. Continued small bowel obstruction. No oral contrast in the colon 8 hours following contrast administration by NG tube. 3. Dilated left abdominal small bowel loops similar to the CT on 05/06/2017. Electronically Signed   By: Odessa FlemingH  Hall M.D.   On: 05/08/2017 08:36   Dg Abd Portable 1v-small Bowel Protocol-position Verification  Result Date: 05/07/2017 CLINICAL DATA:  Nasogastric tube placement. EXAM: PORTABLE ABDOMEN - 1 VIEW COMPARISON:  Radiograph of same day. FINDINGS: Distal tip of nasogastric tube is seen in proximal stomach. Dilated small bowel loops are again noted  concerning for distal small bowel obstruction. No colonic dilatation is noted. IMPRESSION: Distal tip of nasogastric tube is seen in proximal stomach. Dilated small bowel loops are noted concerning for distal small bowel obstruction. Electronically Signed   By: Lupita RaiderJames  Green Jr, M.D.   On: 05/07/2017 13:53   Dg Abd Portable 1v  Result Date: 05/07/2017 CLINICAL DATA:  Follow up small bowel obstruction EXAM: PORTABLE ABDOMEN - 1 VIEW COMPARISON:  05/06/2017 FINDINGS: Persistent dilated small bowel loops are noted in the mid abdomen similar to that seen on prior CT examination. No free air is noted. Contrast is seen within the bladder from recent CT evaluation. No acute bony abnormality is noted. Degenerative changes of the lumbar spine are again seen. IMPRESSION: Relative stable appearing small-bowel obstruction Electronically Signed   By: Alcide CleverMark  Lukens M.D.   On: 05/07/2017 08:45   Discharge Exam: Vitals:   05/16/17 1743 05/16/17 2202  BP: 116/63 108/64  Pulse: 91 83  Resp: 16 17  Temp: 98 F (36.7 C) 98.7 F (37.1 C)  SpO2: 96% 99%   Vitals:   05/16/17 0500 05/16/17 0613 05/16/17 1743 05/16/17 2202  BP:  123/63 116/63 108/64  Pulse:  73 91 83  Resp:  16 16 17   Temp:  98.1 F (36.7 C) 98 F (36.7 C) 98.7 F (37.1 C)  TempSrc:  Oral Oral Oral  SpO2:  95% 96% 99%  Weight: 78 kg (172 lb 1 oz)     Height:        General: Pt is alert, follows commands appropriately, not in acute distress Cardiovascular: Regular rate and rhythm, S1/S2 + Respiratory: Clear to auscultation bilaterally, no wheezing, no crackles, no rhonchi Abdominal: Soft, non tender, non distended, bowel  sounds +, no guarding Extremities: no edema, no cyanosis, pulses palpable bilaterally DP and PT Neuro: Grossly nonfocal  Discharge Instructions   Allergies as of 05/17/2017      Reactions   Nitrofurantoin Monohyd Macro       Medication List    STOP taking these medications   divalproex 500 MG 24 hr  tablet Commonly known as:  DEPAKOTE ER     TAKE these medications   acetaminophen 500 MG tablet Commonly known as:  TYLENOL Take 1,000 mg by mouth every 6 (six) hours as needed for mild pain or headache.   albuterol (2.5 MG/3ML) 0.083% nebulizer solution Commonly known as:  PROVENTIL Take 3 mLs (2.5 mg total) by nebulization every 4 (four) hours as needed for wheezing or shortness of breath.   fluvoxaMINE 100 MG tablet Commonly known as:  LUVOX Take 1 tablet (100 mg total) by mouth 2 (two) times daily.   lamoTRIgine 100 MG tablet Commonly known as:  LAMICTAL Take 100 mg by mouth 2 (two) times daily.   naproxen sodium 220 MG tablet Commonly known as:  ALEVE Take 220 mg by mouth 2 (two) times daily with a meal.   omeprazole 20 MG capsule Commonly known as:  PRILOSEC Take 20 mg by mouth daily.   oxyCODONE 5 MG immediate release tablet Commonly known as:  Oxy IR/ROXICODONE Take 1 tablet (5 mg total) by mouth every 4 (four) hours as needed for moderate pain.   pramipexole 0.125 MG tablet Commonly known as:  MIRAPEX Take 0.125 mg by mouth at bedtime.      Follow-up Information    Almond Lint, MD. Schedule an appointment as soon as possible for a visit in 3 week(s).   Specialty:  General Surgery Why:  for post-operative follow up. Contact information: [address] [place] [address] [phone]        [facility], [place]. Go on 05/24/2017.   Specialty:  General Surgery Why:  at 2:30 PM for staple removal. please arrive 30 minutes early (2:00 PM) to get checked in and fill out any necessary paperwork.  Contact information: [address] [place] [address] [phone]           The results of significant diagnostics from this hospitalization (including imaging, microbiology, ancillary and laboratory) are listed below for reference.     Microbiology: No results found for this or any previous  visit (from the past 240 hour(s)).   Labs: Basic Metabolic Panel: Recent Labs  Lab 05/11/17 0423 05/13/17 0522 05/14/17 0446 05/15/17 0543 05/16/17 0536 05/17/17 0502  NA 142  --  142 140 139 139  K 4.0  --  3.8 4.0 3.6 3.4*  CL 108  --  107 107 104 101  CO2 21*  --  24 26 27 30   GLUCOSE 73  --  86 90 116* 121*  BUN 19  --  15 16 15 15   CREATININE 0.76 0.84 0.74 0.74 0.79 1.07  CALCIUM 8.5*  --  8.3* 8.4* 8.7* 8.7*   Liver Function Tests: No results for input(s): AST, ALT, ALKPHOS, BILITOT, PROT, ALBUMIN in the last 168 hours. No results for input(s): LIPASE, AMYLASE in the last 168 hours. No results for input(s): AMMONIA in the last 168 hours. CBC: Recent Labs  Lab 05/11/17 0423 05/14/17 0446 05/15/17 0543 05/16/17 0536 05/17/17 0502  WBC 7.4 9.4 9.0 8.2 9.3  HGB 14.0 12.5* 12.8* 12.1* 12.1*  HCT 41.6 37.4*  38.1* 36.4* 36.4*  MCV 92.4 94.0 93.8 93.1 93.6  PLT 226 273 304 342 358   Cardiac Enzymes: No results for input(s): CKTOTAL, CKMB, CKMBINDEX, TROPONINI in the last 168 hours. BNP: BNP (last 3 results) No results for input(s): BNP in the last 8760 hours.  ProBNP (last 3 results) No results for input(s): PROBNP in the last 8760 hours.  CBG: No results for input(s): GLUCAP in the last 168 hours.   SIGNED: Time coordinating discharge: Over 30 minutes  Debbora Presto, MD  [facility] 05/17/2017, 5:51 AM Pager [phone]  If 7PM-7AM, please contact night-coverage [email or web address] Password [email or web address]

## 2017-05-25 DIAGNOSIS — J449 Chronic obstructive pulmonary disease, unspecified: Secondary | ICD-10-CM | POA: Diagnosis not present

## 2017-05-25 DIAGNOSIS — F319 Bipolar disorder, unspecified: Secondary | ICD-10-CM | POA: Diagnosis not present

## 2017-05-25 DIAGNOSIS — Z09 Encounter for follow-up examination after completed treatment for conditions other than malignant neoplasm: Secondary | ICD-10-CM | POA: Diagnosis not present

## 2017-05-25 DIAGNOSIS — K56609 Unspecified intestinal obstruction, unspecified as to partial versus complete obstruction: Secondary | ICD-10-CM | POA: Diagnosis not present

## 2017-05-25 DIAGNOSIS — D649 Anemia, unspecified: Secondary | ICD-10-CM | POA: Diagnosis not present

## 2017-05-25 DIAGNOSIS — K8020 Calculus of gallbladder without cholecystitis without obstruction: Secondary | ICD-10-CM | POA: Diagnosis not present

## 2017-06-11 DIAGNOSIS — N289 Disorder of kidney and ureter, unspecified: Secondary | ICD-10-CM | POA: Diagnosis not present

## 2017-06-11 DIAGNOSIS — D649 Anemia, unspecified: Secondary | ICD-10-CM | POA: Diagnosis not present

## 2017-09-10 DIAGNOSIS — F3170 Bipolar disorder, currently in remission, most recent episode unspecified: Secondary | ICD-10-CM | POA: Diagnosis not present

## 2017-09-10 DIAGNOSIS — G2581 Restless legs syndrome: Secondary | ICD-10-CM | POA: Diagnosis not present

## 2017-12-20 DIAGNOSIS — L309 Dermatitis, unspecified: Secondary | ICD-10-CM | POA: Diagnosis not present

## 2017-12-20 DIAGNOSIS — L03119 Cellulitis of unspecified part of limb: Secondary | ICD-10-CM | POA: Diagnosis not present

## 2017-12-20 DIAGNOSIS — F319 Bipolar disorder, unspecified: Secondary | ICD-10-CM | POA: Diagnosis not present

## 2018-01-16 DIAGNOSIS — L0390 Cellulitis, unspecified: Secondary | ICD-10-CM | POA: Diagnosis not present

## 2018-01-16 DIAGNOSIS — L309 Dermatitis, unspecified: Secondary | ICD-10-CM | POA: Diagnosis not present

## 2018-01-28 DIAGNOSIS — L0390 Cellulitis, unspecified: Secondary | ICD-10-CM | POA: Diagnosis not present

## 2018-01-28 DIAGNOSIS — L309 Dermatitis, unspecified: Secondary | ICD-10-CM | POA: Diagnosis not present

## 2018-07-26 DIAGNOSIS — K432 Incisional hernia without obstruction or gangrene: Secondary | ICD-10-CM | POA: Diagnosis not present

## 2018-07-26 DIAGNOSIS — R1110 Vomiting, unspecified: Secondary | ICD-10-CM | POA: Diagnosis not present

## 2018-11-19 DIAGNOSIS — K432 Incisional hernia without obstruction or gangrene: Secondary | ICD-10-CM | POA: Diagnosis not present

## 2018-11-25 ENCOUNTER — Other Ambulatory Visit: Payer: Self-pay | Admitting: General Surgery

## 2018-11-25 DIAGNOSIS — K432 Incisional hernia without obstruction or gangrene: Secondary | ICD-10-CM

## 2018-12-11 ENCOUNTER — Other Ambulatory Visit: Payer: Medicare HMO

## 2018-12-11 ENCOUNTER — Other Ambulatory Visit: Payer: Self-pay

## 2018-12-11 ENCOUNTER — Ambulatory Visit
Admission: RE | Admit: 2018-12-11 | Discharge: 2018-12-11 | Disposition: A | Discharge status: Home / Self Care | Payer: Medicare HMO | Source: Ambulatory Visit | Attending: General Surgery | Admitting: General Surgery

## 2018-12-11 DIAGNOSIS — K4020 Bilateral inguinal hernia, without obstruction or gangrene, not specified as recurrent: Secondary | ICD-10-CM | POA: Diagnosis not present

## 2018-12-11 DIAGNOSIS — K432 Incisional hernia without obstruction or gangrene: Secondary | ICD-10-CM

## 2018-12-11 MED ORDER — IOPAMIDOL (ISOVUE-300) INJECTION 61%
100.0000 mL | Freq: Once | INTRAVENOUS | Status: AC | PRN
  Administered 2018-12-11: 100 mL via INTRAVENOUS

## 2019-01-23 DIAGNOSIS — K432 Incisional hernia without obstruction or gangrene: Secondary | ICD-10-CM | POA: Diagnosis not present

## 2019-04-16 DIAGNOSIS — Z23 Encounter for immunization: Secondary | ICD-10-CM | POA: Diagnosis not present

## 2019-05-19 DIAGNOSIS — B029 Zoster without complications: Secondary | ICD-10-CM | POA: Diagnosis not present

## 2019-05-23 DIAGNOSIS — B029 Zoster without complications: Secondary | ICD-10-CM | POA: Diagnosis not present

## 2019-06-16 DIAGNOSIS — J189 Pneumonia, unspecified organism: Secondary | ICD-10-CM | POA: Diagnosis not present

## 2019-06-16 DIAGNOSIS — R0781 Pleurodynia: Secondary | ICD-10-CM | POA: Diagnosis not present

## 2019-06-18 DIAGNOSIS — J189 Pneumonia, unspecified organism: Secondary | ICD-10-CM | POA: Diagnosis not present

## 2019-06-24 ENCOUNTER — Ambulatory Visit: Payer: Medicare HMO | Attending: Internal Medicine

## 2019-06-24 DIAGNOSIS — Z23 Encounter for immunization: Secondary | ICD-10-CM | POA: Insufficient documentation

## 2019-06-24 NOTE — Progress Notes (Signed)
   Covid-19 Vaccination Clinic  Name:  Christian Hurley    MRN: [id number] DOB: 1941/03/21  06/24/2019  [name] was observed post Covid-19 immunization for 15 minutes without incidence. He was provided with Vaccine Information Sheet and instruction to access the V-Safe system.   [name] was instructed to call 911 with any severe reactions post vaccine: Marland Kitchen Difficulty breathing  . Swelling of your face and throat  . A fast heartbeat  . A bad rash all over your body  . Dizziness and weakness    Immunizations Administered    Name Date Dose VIS Date Route   Pfizer COVID-19 Vaccine 06/24/2019 12:32 PM 0.3 mL 05/16/2019 Intramuscular   Manufacturer: [employer], [employer]   Lot: [id number]   NDC: [address]-3

## 2019-06-25 DIAGNOSIS — J180 Bronchopneumonia, unspecified organism: Secondary | ICD-10-CM | POA: Diagnosis not present

## 2019-06-25 DIAGNOSIS — J189 Pneumonia, unspecified organism: Secondary | ICD-10-CM | POA: Diagnosis not present

## 2019-07-14 ENCOUNTER — Ambulatory Visit: Payer: Medicare HMO | Attending: Internal Medicine

## 2019-07-14 DIAGNOSIS — Z23 Encounter for immunization: Secondary | ICD-10-CM | POA: Insufficient documentation

## 2019-07-14 NOTE — Progress Notes (Signed)
   Covid-19 Vaccination Clinic  Name:  Christian Hurley    MRN: [id number] DOB: [birth year]  07/14/2019  Mr. Christian Hurley was observed post Covid-19 immunization for 15 minutes without incidence. He was provided with Vaccine Information Sheet and instruction to access the V-Safe system.   Mr. Christian Hurley was instructed to call 911 with any severe reactions post vaccine: Marland Kitchen Difficulty breathing  . Swelling of your face and throat  . A fast heartbeat  . A bad rash all over your body  . Dizziness and weakness    Immunizations Administered    Name Date Dose VIS Date Route   Pfizer COVID-19 Vaccine 07/14/2019 11:32 AM 0.3 mL 05/16/2019 Intramuscular   Manufacturer: [employer], [employer]   Lot: [id number]   NDC: [address]-2

## 2019-08-22 DIAGNOSIS — K432 Incisional hernia without obstruction or gangrene: Secondary | ICD-10-CM | POA: Diagnosis not present

## 2019-11-26 DIAGNOSIS — G2581 Restless legs syndrome: Secondary | ICD-10-CM | POA: Diagnosis not present

## 2019-11-26 DIAGNOSIS — F3170 Bipolar disorder, currently in remission, most recent episode unspecified: Secondary | ICD-10-CM | POA: Diagnosis not present

## 2020-02-18 DIAGNOSIS — Z23 Encounter for immunization: Secondary | ICD-10-CM | POA: Diagnosis not present

## 2020-02-25 DIAGNOSIS — G2581 Restless legs syndrome: Secondary | ICD-10-CM | POA: Diagnosis not present

## 2020-02-25 DIAGNOSIS — F3170 Bipolar disorder, currently in remission, most recent episode unspecified: Secondary | ICD-10-CM | POA: Diagnosis not present

## 2020-04-03 ENCOUNTER — Ambulatory Visit: Payer: Medicare HMO | Attending: Internal Medicine

## 2020-04-03 DIAGNOSIS — Z23 Encounter for immunization: Secondary | ICD-10-CM

## 2020-04-03 NOTE — Progress Notes (Signed)
   Covid-19 Vaccination Clinic  Name:  JASSIAH VIVIANO    MRN: [id number] DOB: Jun 16, 1940  04/03/2020  Mr. Vargo was observed post Covid-19 immunization for 15 minutes without incident. He was provided with Vaccine Information Sheet and instruction to access the V-Safe system.   Mr. Hardenbrook was instructed to call 911 with any severe reactions post vaccine: Marland Kitchen Difficulty breathing  . Swelling of face and throat  . A fast heartbeat  . A bad rash all over body  . Dizziness and weakness

## 2020-05-26 DIAGNOSIS — G2581 Restless legs syndrome: Secondary | ICD-10-CM | POA: Diagnosis not present

## 2020-05-26 DIAGNOSIS — F3170 Bipolar disorder, currently in remission, most recent episode unspecified: Secondary | ICD-10-CM | POA: Diagnosis not present

## 2020-08-23 DIAGNOSIS — G2581 Restless legs syndrome: Secondary | ICD-10-CM | POA: Diagnosis not present

## 2020-08-23 DIAGNOSIS — F3170 Bipolar disorder, currently in remission, most recent episode unspecified: Secondary | ICD-10-CM | POA: Diagnosis not present

## 2020-11-08 DIAGNOSIS — G2581 Restless legs syndrome: Secondary | ICD-10-CM | POA: Diagnosis not present

## 2020-11-08 DIAGNOSIS — F3170 Bipolar disorder, currently in remission, most recent episode unspecified: Secondary | ICD-10-CM | POA: Diagnosis not present

## 2021-02-10 DIAGNOSIS — F3170 Bipolar disorder, currently in remission, most recent episode unspecified: Secondary | ICD-10-CM | POA: Diagnosis not present

## 2021-02-10 DIAGNOSIS — G2581 Restless legs syndrome: Secondary | ICD-10-CM | POA: Diagnosis not present

## 2021-04-20 DIAGNOSIS — Z23 Encounter for immunization: Secondary | ICD-10-CM | POA: Diagnosis not present

## 2021-08-29 DIAGNOSIS — F429 Obsessive-compulsive disorder, unspecified: Secondary | ICD-10-CM | POA: Diagnosis not present

## 2021-08-29 DIAGNOSIS — F3342 Major depressive disorder, recurrent, in full remission: Secondary | ICD-10-CM | POA: Diagnosis not present

## 2021-08-29 DIAGNOSIS — F411 Generalized anxiety disorder: Secondary | ICD-10-CM | POA: Diagnosis not present

## 2022-01-03 DIAGNOSIS — F411 Generalized anxiety disorder: Secondary | ICD-10-CM | POA: Diagnosis not present

## 2022-01-03 DIAGNOSIS — F3342 Major depressive disorder, recurrent, in full remission: Secondary | ICD-10-CM | POA: Diagnosis not present

## 2022-01-03 DIAGNOSIS — F429 Obsessive-compulsive disorder, unspecified: Secondary | ICD-10-CM | POA: Diagnosis not present

## 2022-04-13 DIAGNOSIS — Z23 Encounter for immunization: Secondary | ICD-10-CM | POA: Diagnosis not present

## 2022-05-06 DIAGNOSIS — F411 Generalized anxiety disorder: Secondary | ICD-10-CM | POA: Diagnosis not present

## 2022-05-06 DIAGNOSIS — F429 Obsessive-compulsive disorder, unspecified: Secondary | ICD-10-CM | POA: Diagnosis not present

## 2022-05-06 DIAGNOSIS — F3342 Major depressive disorder, recurrent, in full remission: Secondary | ICD-10-CM | POA: Diagnosis not present

## 2022-07-26 DIAGNOSIS — Z13220 Encounter for screening for lipoid disorders: Secondary | ICD-10-CM | POA: Diagnosis not present

## 2022-07-26 DIAGNOSIS — Z130 Encounter for screening for diseases of the blood and blood-forming organs and certain disorders involving the immune mechanism: Secondary | ICD-10-CM | POA: Diagnosis not present

## 2022-07-26 DIAGNOSIS — K469 Unspecified abdominal hernia without obstruction or gangrene: Secondary | ICD-10-CM | POA: Diagnosis not present

## 2022-07-26 DIAGNOSIS — R7301 Impaired fasting glucose: Secondary | ICD-10-CM | POA: Diagnosis not present

## 2022-07-26 DIAGNOSIS — Z136 Encounter for screening for cardiovascular disorders: Secondary | ICD-10-CM | POA: Diagnosis not present

## 2022-07-26 DIAGNOSIS — Z23 Encounter for immunization: Secondary | ICD-10-CM | POA: Diagnosis not present

## 2022-07-26 DIAGNOSIS — J449 Chronic obstructive pulmonary disease, unspecified: Secondary | ICD-10-CM | POA: Diagnosis not present

## 2022-07-26 DIAGNOSIS — F319 Bipolar disorder, unspecified: Secondary | ICD-10-CM | POA: Diagnosis not present

## 2022-09-25 DIAGNOSIS — R7303 Prediabetes: Secondary | ICD-10-CM | POA: Diagnosis not present

## 2022-09-25 DIAGNOSIS — Z0000 Encounter for general adult medical examination without abnormal findings: Secondary | ICD-10-CM | POA: Diagnosis not present

## 2022-09-25 DIAGNOSIS — J449 Chronic obstructive pulmonary disease, unspecified: Secondary | ICD-10-CM | POA: Diagnosis not present

## 2022-09-25 DIAGNOSIS — F319 Bipolar disorder, unspecified: Secondary | ICD-10-CM | POA: Diagnosis not present

## 2022-09-25 DIAGNOSIS — H9193 Unspecified hearing loss, bilateral: Secondary | ICD-10-CM | POA: Diagnosis not present

## 2022-09-25 DIAGNOSIS — R110 Nausea: Secondary | ICD-10-CM | POA: Diagnosis not present

## 2023-01-15 DIAGNOSIS — F411 Generalized anxiety disorder: Secondary | ICD-10-CM | POA: Diagnosis not present

## 2023-01-15 DIAGNOSIS — F3342 Major depressive disorder, recurrent, in full remission: Secondary | ICD-10-CM | POA: Diagnosis not present

## 2023-01-15 DIAGNOSIS — F429 Obsessive-compulsive disorder, unspecified: Secondary | ICD-10-CM | POA: Diagnosis not present

## 2023-05-14 DIAGNOSIS — F3342 Major depressive disorder, recurrent, in full remission: Secondary | ICD-10-CM | POA: Diagnosis not present

## 2023-05-14 DIAGNOSIS — F411 Generalized anxiety disorder: Secondary | ICD-10-CM | POA: Diagnosis not present

## 2023-05-14 DIAGNOSIS — F429 Obsessive-compulsive disorder, unspecified: Secondary | ICD-10-CM | POA: Diagnosis not present

## 2024-05-05 ENCOUNTER — Emergency Department (HOSPITAL_COMMUNITY)

## 2024-05-05 ENCOUNTER — Emergency Department (HOSPITAL_COMMUNITY)
Admission: EM | Admit: 2024-05-05 | Discharge: 2024-05-05 | Disposition: A | Discharge status: Home / Self Care | Source: Ambulatory Visit | Attending: Emergency Medicine | Admitting: Emergency Medicine

## 2024-05-05 ENCOUNTER — Other Ambulatory Visit: Payer: Self-pay

## 2024-05-05 ENCOUNTER — Encounter (HOSPITAL_COMMUNITY): Payer: Self-pay | Admitting: *Deleted

## 2024-05-05 DIAGNOSIS — J449 Chronic obstructive pulmonary disease, unspecified: Secondary | ICD-10-CM | POA: Insufficient documentation

## 2024-05-05 DIAGNOSIS — R062 Wheezing: Secondary | ICD-10-CM | POA: Diagnosis present

## 2024-05-05 LAB — COMPREHENSIVE METABOLIC PANEL WITH GFR
ALT: 8 U/L (ref 0–44)
AST: 21 U/L (ref 15–41)
Albumin: 4 g/dL (ref 3.5–5.0)
Alkaline Phosphatase: 97 U/L (ref 38–126)
Anion gap: 9 (ref 5–15)
BUN: 20 mg/dL (ref 8–23)
CO2: 27 mmol/L (ref 22–32)
Calcium: 8.9 mg/dL (ref 8.9–10.3)
Chloride: 103 mmol/L (ref 98–111)
Creatinine, Ser: 1.32 mg/dL — ABNORMAL HIGH (ref 0.61–1.24)
GFR, Estimated: 53 mL/min — ABNORMAL LOW (ref >60)
Glucose, Bld: 105 mg/dL — ABNORMAL HIGH (ref 70–99)
Potassium: 4.2 mmol/L (ref 3.5–5.1)
Sodium: 139 mmol/L (ref 135–145)
Total Bilirubin: 0.6 mg/dL (ref 0.0–1.2)
Total Protein: 6.2 g/dL — ABNORMAL LOW (ref 6.5–8.1)

## 2024-05-05 LAB — RESP PANEL BY RT-PCR (RSV, FLU A&B, COVID)  RVPGX2
Influenza A by PCR: NEGATIVE
Influenza B by PCR: NEGATIVE
Resp Syncytial Virus by PCR: NEGATIVE
SARS Coronavirus 2 by RT PCR: NEGATIVE

## 2024-05-05 LAB — CBC WITH DIFFERENTIAL/PLATELET
Abs Immature Granulocytes: 0.02 K/uL (ref 0.00–0.07)
Basophils Absolute: 0.1 K/uL (ref 0.0–0.1)
Basophils Relative: 1 %
Eosinophils Absolute: 0.4 K/uL (ref 0.0–0.5)
Eosinophils Relative: 5 %
HCT: 42.7 % (ref 39.0–52.0)
Hemoglobin: 13.8 g/dL (ref 13.0–17.0)
Immature Granulocytes: 0 %
Lymphocytes Relative: 19 %
Lymphs Abs: 1.5 K/uL (ref 0.7–4.0)
MCH: 30.8 pg (ref 26.0–34.0)
MCHC: 32.3 g/dL (ref 30.0–36.0)
MCV: 95.3 fL (ref 80.0–100.0)
Monocytes Absolute: 1 K/uL (ref 0.1–1.0)
Monocytes Relative: 13 %
Neutro Abs: 4.8 K/uL (ref 1.7–7.7)
Neutrophils Relative %: 62 %
Platelets: 237 K/uL (ref 150–400)
RBC: 4.48 MIL/uL (ref 4.22–5.81)
RDW: 13.5 % (ref 11.5–15.5)
WBC: 7.8 K/uL (ref 4.0–10.5)
nRBC: 0 % (ref 0.0–0.2)

## 2024-05-05 LAB — PRO BRAIN NATRIURETIC PEPTIDE: Pro Brain Natriuretic Peptide: 125 pg/mL (ref <300.0)

## 2024-05-05 MED ORDER — IPRATROPIUM-ALBUTEROL 0.5-2.5 (3) MG/3ML IN SOLN
3.0000 mL | Freq: Once | RESPIRATORY_TRACT | Status: AC
  Administered 2024-05-05: 3 mL via RESPIRATORY_TRACT
  Filled 2024-05-05: qty 3

## 2024-05-05 MED ORDER — PREDNISONE 10 MG PO TABS: 20.0000 mg | ORAL_TABLET | Freq: Every day | ORAL | 0 refills | Status: AC

## 2024-05-05 MED ORDER — ALBUTEROL SULFATE HFA 108 (90 BASE) MCG/ACT IN AERS
1.0000 | INHALATION_SPRAY | Freq: Four times a day (QID) | RESPIRATORY_TRACT | Status: DC | PRN
  Administered 2024-05-05: 2 via RESPIRATORY_TRACT
  Filled 2024-05-05: qty 6.7

## 2024-05-05 NOTE — Progress Notes (Signed)
 Received consult for placement. CSW spoke with the pt's sister, Devere. She reports that the pt lives alone and expressed concerns about his ability to continue living independently. She reports living out of town and visits pt once a month. She stated concerns regarding the pt not taking his medications as prescribed, saying, "I don't know when the last time he took his medications was." She also reported concerns about the pt's eating habits, stating that he only eats microwaved TV dinners. She described the home as barely livable, with only a narrow pathway for him to move around.  CSW inquired whether the pt is able to move around the home independently. Devere confirmed he can, but reiterated that the home environment is barely livable. She is requesting that the pt be placed in a long-term care facility.  CSW inquired whether the pt is able to private pay for LTC. The sister reported he cannot, as he only receives approximately $1,700 per month. CSW asked if the pt has Medicaid; the sister stated he does not. CSW informed her that the pt cannot be placed in LTC without Medicaid in place. Devere stated she is not familiar with the Medicaid application process. CSW explained that the pt or family will need to apply through [facility].  The sister agreed to a referral to [facility] to assist with community placement options. CSW asked if the pt could stay with her while they work on obtaining Medicaid; she stated she does not have space for him. She also stated there are no other family or friends able to assist.  CSW inquired whether the pt would want home health services; the sister stated she was unsure at this time. CSW encouraged her to inform the MD if she decides to proceed with home health services.

## 2024-05-05 NOTE — ED Provider Notes (Signed)
 [place] EMERGENCY DEPARTMENT AT [facility] Provider Note   CSN: [id number] Arrival date & time: 05/05/24  1618     Patient presents with: Wheezing   ADAL SERENO is a 83 y.o. male.    Wheezing    Patient has a history of bipolar disorder, prostate hyperplasia, COPD, bowel obstruction, ventral hernia.  Patient has noticed a hernia in his abdomen for a while.  Family brought him to the surgeons office today for evaluation.  Sister reports that at the office they evaluated his hernia.  They did not feel that he required any surgery.  They felt the risks of surgery outweighed benefits.  Reportedly while he was there he also had a low-grade temperature of 99.  Patient has been having trouble with coughing ongoing for several weeks.  He was instructed to come to the ED for further evaluation.  Patient himself denies any complaints he is not having any chest pain no abdominal pain.  No fevers or chills  Prior to Admission medications   Medication Sig Start Date End Date Taking? Authorizing Provider  predniSONE (DELTASONE) 10 MG tablet Take 2 tablets (20 mg total) by mouth daily for 5 days. 05/05/24 05/10/24 Yes Randol Simmonds, MD  acetaminophen  (TYLENOL ) 500 MG tablet Take 1,000 mg by mouth every 6 (six) hours as needed for mild pain or headache.    [provider]  albuterol  (PROVENTIL ) (2.5 MG/3ML) 0.083% nebulizer solution Take 3 mLs (2.5 mg total) by nebulization every 4 (four) hours as needed for wheezing or shortness of breath. 05/16/17   Billy Junnie HERO, MD  fluvoxaMINE  (LUVOX ) 100 MG tablet Take 1 tablet (100 mg total) by mouth 2 (two) times daily. 07/01/12   Joshua Debby CROME, MD  lamoTRIgine  (LAMICTAL ) 100 MG tablet Take 100 mg by mouth 2 (two) times daily.    [provider]  naproxen sodium (ANAPROX) 220 MG tablet Take 220 mg by mouth 2 (two) times daily with a meal.    [provider]  omeprazole (PRILOSEC) 20 MG capsule Take 20 mg by mouth daily.     [provider]  oxyCODONE  (OXY IR/ROXICODONE ) 5 MG immediate release tablet Take 1 tablet (5 mg total) by mouth every 4 (four) hours as needed for moderate pain. 05/16/17   Billy Junnie HERO, MD  pramipexole  (MIRAPEX ) 0.125 MG tablet Take 0.125 mg by mouth at bedtime. 03/06/17   [provider]    Allergies: Nitrofurantoin monohyd macro    Review of Systems  Respiratory:  Positive for wheezing.     Updated Vital Signs BP 129/70 (BP Location: Right Arm)   Pulse 63   Temp 97.9 F (36.6 C) (Oral)   Resp 20   Ht 1.765 m (5' 9.5)   Wt 81.6 kg   SpO2 98%   BMI 26.20 kg/m   Physical Exam Vitals and nursing note reviewed.  Constitutional:      Appearance: He is well-developed. He is not diaphoretic.  HENT:     Head: Normocephalic and atraumatic.     Right Ear: External ear normal.     Left Ear: External ear normal.  Eyes:     General: No scleral icterus.       Right eye: No discharge.        Left eye: No discharge.     Conjunctiva/sclera: Conjunctivae normal.  Neck:     Trachea: No tracheal deviation.  Cardiovascular:     Rate and Rhythm: Normal rate and regular rhythm.  Pulmonary:     Effort: Pulmonary effort is normal. No respiratory distress.     Breath sounds: No stridor. Wheezing present. No rales.  Abdominal:     General: Bowel sounds are normal. There is no distension.     Palpations: Abdomen is soft.     Tenderness: There is no abdominal tenderness. There is no guarding or rebound.     Comments: Midline ventral hernia palpated, easily reduced  Musculoskeletal:        General: No tenderness or deformity.     Cervical back: Neck supple.  Skin:    General: Skin is warm and dry.     Findings: No rash.  Neurological:     General: No focal deficit present.     Mental Status: He is alert.     Cranial Nerves: No cranial nerve deficit, dysarthria or facial asymmetry.     Sensory: No sensory deficit.     Motor: No abnormal muscle tone or seizure  activity.     Coordination: Coordination normal.  Psychiatric:        Mood and Affect: Mood normal.     (all labs ordered are listed, but only abnormal results are displayed) Labs Reviewed  COMPREHENSIVE METABOLIC PANEL WITH GFR - Abnormal; Notable for the following components:      Result Value   Glucose, Bld 105 (*)    Creatinine, Ser 1.32 (*)    Total Protein 6.2 (*)    GFR, Estimated 53 (*)    All other components within normal limits  RESP PANEL BY RT-PCR (RSV, FLU A&B, COVID)  [id number]  PRO BRAIN NATRIURETIC PEPTIDE  CBC WITH DIFFERENTIAL/PLATELET    EKG: EKG Interpretation Date/Time:  Monday May 05 2024 18:06:11 EST Ventricular Rate:  69 PR Interval:  173 QRS Duration:  88 QT Interval:  368 QTC Calculation: 395 R Axis:   69  Text Interpretation: Sinus rhythm Multiple premature complexes, vent & supraven No significant change since last tracing  except pvc Confirmed by Randol Simmonds [phone]) on 05/05/2024 6:28:54 PM  Radiology: DG Chest 2 View Result Date: 05/05/2024 EXAM: 2 VIEW(S) XRAY OF THE CHEST 05/05/2024 07:54:00 PM COMPARISON: 05/05/2024 CLINICAL HISTORY: repeat xray recommended, shortness of breath FINDINGS: LUNGS AND PLEURA: Right hemidiaphragm eventration. No focal pulmonary opacity. No pleural effusion. No pneumothorax. HEART AND MEDIASTINUM: Thoracic aortic atherosclerosis. No acute abnormality of the cardiac and mediastinal silhouettes. BONES AND SOFT TISSUES: No acute osseous abnormality. IMPRESSION: 1. No acute cardiopulmonary findings. Electronically signed by: Norman Gatlin MD 05/05/2024 08:48 PM EST RP Workstation: [id number]   DG Chest Port 1 View Result Date: 05/05/2024 CLINICAL DATA:  Shortness of breath EXAM: PORTABLE CHEST 1 VIEW COMPARISON:  Chest x-ray 05/14/2017 FINDINGS: Lordotic positioning limits evaluation. Cannot exclude airspace consolidation in the right lower lobe inferiorly. The costophrenic angles are clear. There is no pneumothorax. The  cardiomediastinal silhouette appears within normal limits. No acute fractures are seen. IMPRESSION: Lordotic positioning limits evaluation. Cannot exclude airspace consolidation in the right lower lobe inferiorly. Consider repeat PA and lateral chest x-ray. Electronically Signed   By: Greig Pique M.D.   On: 05/05/2024 18:52     Procedures   Medications Ordered in the ED  albuterol  (VENTOLIN  HFA) 108 (90 Base) MCG/ACT inhaler 1-2 puff (has no administration in time range)  ipratropium-albuterol  (DUONEB) 0.5-2.5 (3) MG/3ML nebulizer solution 3 mL (3 mLs Nebulization Given 05/05/24 1804)    Clinical Course as of 05/05/24 2057  Mon May 05, 2024  1941 CBC with  Differential/Platelet CBC normal.  Metabolic panel normal. [JK]  1941 Chest x-ray without definite pneumonia.  Repeat recommended [JK]  2005 Resp panel by RT-PCR (RSV, Flu A&B, Covid) Anterior Nasal Swab normal [JK]  2005 Pro Brain natriuretic peptide nl [JK]  2050 Chest x-ray without acute findings [JK]    Clinical Course User Index [JK] Randol Simmonds, MD                                 Medical Decision Making Amount and/or Complexity of Data Reviewed Labs: ordered. Decision-making details documented in ED Course. Radiology: ordered.  Risk Prescription drug management.  Records reviewed from the outpatient visit today.  Patient had a temperature of 99.4.  His blood pressure and pulse were normal.  His doctor was concerned about his current living situation and ability to care for himself.  I also concerned that he might have an infection.  They suggested he come to the ED to see a social worker and be evaluated for infection  Patient's ED workup is reassuring.  His CBC and metabolic panel are normal.  His BNP is normal other than CHF.  COVID flu RSV is negative.  Chest x-ray also does not show pneumonia.  Patient did have some wheezing on exam.  I reviewed his old records and he does have history of COPD.  Patient with  asymptomatic despite this mild wheezing.  He was given a breathing treatment and a dose of steroids.  No indication for admission to the hospital at this time.  Will have him go home with prescription for prednisone and given an albuterol  inhaler to take home.  Sister is also concerned about him living at home.  Patient was evaluated by social work.  Patient is not a candidate for placement at this time, primarily due to financial reasons.  I will provide information about home resources.  Sister was concerned about the cleanliness at home.  Evaluation and diagnostic testing in the emergency department does not suggest an emergent condition requiring admission or immediate intervention beyond what has been performed at this time.  The patient is safe for discharge and has been instructed to return immediately for worsening symptoms, change in symptoms or any other concerns.     Final diagnoses:  Chronic obstructive pulmonary disease, unspecified COPD type [facility])    ED Discharge Orders          Ordered    predniSONE (DELTASONE) 10 MG tablet  Daily        05/05/24 2052               Randol Simmonds, MD 05/05/24 2057

## 2024-05-05 NOTE — ED Notes (Signed)
 Pt sister states she is very concerned for his living situation and health condition

## 2024-05-05 NOTE — ED Triage Notes (Signed)
 Pt arrives accompanied by sister with complaints of wheezing and a cough. PT was just seen at a clinic for his hernia, when he was directed to the ER. Sister expressed social and living concerns, and requests that he be seen by a child psychotherapist.

## 2024-05-05 NOTE — ED Notes (Addendum)
 Wrong patient

## 2024-05-05 NOTE — ED Notes (Signed)
 Sister very concerned about patients living situation.  Encouraged her to follow up with [facility] social worker in the morning.  She already talked to one insurance account manager. Pt instructed how to use albuterol  and get his prescription.  Pt ate sandwich before he left

## 2024-05-05 NOTE — Discharge Instructions (Signed)
 Take the steroids as prescribed to help with the cough and congestion.  Use the inhaler as directed.  Please review the discharge instructions for possible resources to help at home.  Follow-up with your primary care doctor in the next week or so to be rechecked
